# Patient Record
Sex: Female | Born: 1935 | Race: White | Hispanic: No | Marital: Married | Smoking: Never smoker
Health system: Southern US, Community
[De-identification: ages and names within clinical notes are randomized; demographics above are authoritative.]

## PROBLEM LIST (undated history)

## (undated) DIAGNOSIS — M549 Dorsalgia, unspecified: Secondary | ICD-10-CM

## (undated) DIAGNOSIS — I10 Essential (primary) hypertension: Secondary | ICD-10-CM

## (undated) DIAGNOSIS — H3530 Unspecified macular degeneration: Secondary | ICD-10-CM

## (undated) DIAGNOSIS — E785 Hyperlipidemia, unspecified: Secondary | ICD-10-CM

## (undated) DIAGNOSIS — N814 Uterovaginal prolapse, unspecified: Secondary | ICD-10-CM

## (undated) DIAGNOSIS — I4891 Unspecified atrial fibrillation: Secondary | ICD-10-CM

## (undated) DIAGNOSIS — B029 Zoster without complications: Secondary | ICD-10-CM

## (undated) DIAGNOSIS — K449 Diaphragmatic hernia without obstruction or gangrene: Secondary | ICD-10-CM

## (undated) DIAGNOSIS — J479 Bronchiectasis, uncomplicated: Secondary | ICD-10-CM

## (undated) DIAGNOSIS — R42 Dizziness and giddiness: Secondary | ICD-10-CM

## (undated) DIAGNOSIS — J984 Other disorders of lung: Secondary | ICD-10-CM

## (undated) HISTORY — DX: Uterovaginal prolapse, unspecified: N81.4

## (undated) HISTORY — PX: TONSILLECTOMY: SUR1361

## (undated) HISTORY — PX: ABDOMINAL HYSTERECTOMY: SHX81

## (undated) HISTORY — DX: Bronchiectasis, uncomplicated: J47.9

## (undated) HISTORY — DX: Diaphragmatic hernia without obstruction or gangrene: K44.9

## (undated) HISTORY — DX: Hyperlipidemia, unspecified: E78.5

## (undated) HISTORY — DX: Other disorders of lung: J98.4

## (undated) HISTORY — PX: JOINT REPLACEMENT: SHX530

## (undated) HISTORY — PX: COLON SURGERY: SHX602

## (undated) HISTORY — PX: CATARACT EXTRACTION: SUR2

## (undated) HISTORY — DX: Unspecified macular degeneration: H35.30

---

## 2006-06-06 ENCOUNTER — Ambulatory Visit: Payer: Self-pay | Admitting: Family Medicine

## 2007-08-14 ENCOUNTER — Ambulatory Visit: Payer: Self-pay | Admitting: Family Medicine

## 2008-08-26 ENCOUNTER — Ambulatory Visit: Payer: Self-pay | Admitting: Family Medicine

## 2009-08-29 ENCOUNTER — Ambulatory Visit: Payer: Self-pay | Admitting: Family Medicine

## 2010-09-04 ENCOUNTER — Ambulatory Visit: Payer: Self-pay | Admitting: Family Medicine

## 2010-12-07 ENCOUNTER — Ambulatory Visit: Payer: Self-pay | Admitting: Oncology

## 2010-12-22 ENCOUNTER — Ambulatory Visit: Payer: Self-pay | Admitting: Oncology

## 2011-01-07 ENCOUNTER — Ambulatory Visit: Payer: Self-pay | Admitting: Oncology

## 2011-02-06 ENCOUNTER — Ambulatory Visit: Payer: Self-pay | Admitting: Oncology

## 2011-08-18 ENCOUNTER — Emergency Department: Payer: Self-pay | Admitting: Emergency Medicine

## 2011-09-06 ENCOUNTER — Ambulatory Visit: Payer: Self-pay

## 2011-09-20 ENCOUNTER — Ambulatory Visit: Payer: Self-pay

## 2012-03-26 DIAGNOSIS — I10 Essential (primary) hypertension: Secondary | ICD-10-CM | POA: Insufficient documentation

## 2012-09-10 ENCOUNTER — Ambulatory Visit: Payer: Self-pay | Admitting: Pediatrics

## 2014-03-31 ENCOUNTER — Ambulatory Visit: Payer: Self-pay | Admitting: Pediatrics

## 2014-05-26 ENCOUNTER — Ambulatory Visit: Payer: Self-pay | Admitting: Ophthalmology

## 2014-06-23 ENCOUNTER — Ambulatory Visit: Payer: Self-pay | Admitting: Ophthalmology

## 2014-09-28 ENCOUNTER — Ambulatory Visit: Payer: Self-pay | Admitting: Family Medicine

## 2015-01-11 ENCOUNTER — Ambulatory Visit: Admit: 2015-01-11 | Disposition: A | Discharge status: Home / Self Care | Payer: Self-pay | Admitting: Specialist

## 2015-01-11 LAB — URINALYSIS, COMPLETE
BACTERIA: NONE SEEN
BLOOD: NEGATIVE
Bilirubin,UR: NEGATIVE
Glucose,UR: NEGATIVE mg/dL (ref 0–75)
Ketone: NEGATIVE
LEUKOCYTE ESTERASE: NEGATIVE
NITRITE: NEGATIVE
PH: 6 (ref 4.5–8.0)
Protein: NEGATIVE
RBC,UR: 1 /HPF (ref 0–5)
Specific Gravity: 1.009 (ref 1.003–1.030)
Squamous Epithelial: NONE SEEN

## 2015-01-11 LAB — BASIC METABOLIC PANEL
ANION GAP: 8 (ref 7–16)
BUN: 23 mg/dL — ABNORMAL HIGH
Calcium, Total: 9.1 mg/dL
Chloride: 104 mmol/L
Co2: 25 mmol/L
Creatinine: 0.77 mg/dL
EGFR (Non-African Amer.): >60
GLUCOSE: 86 mg/dL
POTASSIUM: 4.6 mmol/L
Sodium: 137 mmol/L

## 2015-01-11 LAB — APTT: ACTIVATED PTT: 29.9 s (ref 23.6–35.9)

## 2015-01-11 LAB — MRSA PCR SCREENING

## 2015-01-11 LAB — PROTIME-INR
INR: 1
PROTHROMBIN TIME: 13.1 s

## 2015-01-11 LAB — CBC
HCT: 38 % (ref 35.0–47.0)
HGB: 12.4 g/dL (ref 12.0–16.0)
MCH: 31.7 pg (ref 26.0–34.0)
MCHC: 32.6 g/dL (ref 32.0–36.0)
MCV: 97 fL (ref 80–100)
PLATELETS: 188 10*3/uL (ref 150–440)
RBC: 3.9 10*6/uL (ref 3.80–5.20)
RDW: 13.1 % (ref 11.5–14.5)
WBC: 5.4 10*3/uL (ref 3.6–11.0)

## 2015-01-18 ENCOUNTER — Inpatient Hospital Stay
Admit: 2015-01-18 | Disposition: A | Discharge status: Skilled Nursing Facility | Payer: Self-pay | Attending: Specialist | Admitting: Specialist

## 2015-01-18 LAB — CREATININE, SERUM
CREATININE: 0.68 mg/dL
EGFR (Non-African Amer.): >60

## 2015-01-19 LAB — HEMOGLOBIN: HGB: 8.9 g/dL — AB (ref 12.0–16.0)

## 2015-01-20 LAB — BASIC METABOLIC PANEL
ANION GAP: 8 (ref 7–16)
BUN: 20 mg/dL
CALCIUM: 8.4 mg/dL — AB
CO2: 22 mmol/L
Chloride: 101 mmol/L
Creatinine: 1.19 mg/dL — ABNORMAL HIGH
GFR CALC AF AMER: 51 — AB
GFR CALC NON AF AMER: 44 — AB
Glucose: 125 mg/dL — ABNORMAL HIGH
Potassium: 4.8 mmol/L
SODIUM: 131 mmol/L — AB

## 2015-01-20 LAB — CBC WITH DIFFERENTIAL/PLATELET
BASOS ABS: 0 10*3/uL (ref 0.0–0.1)
BASOS PCT: 0.4 %
EOS ABS: 0 10*3/uL (ref 0.0–0.7)
Eosinophil %: 0.1 %
HCT: 28.9 % — ABNORMAL LOW (ref 35.0–47.0)
HGB: 9.4 g/dL — ABNORMAL LOW (ref 12.0–16.0)
LYMPHS ABS: 1.1 10*3/uL (ref 1.0–3.6)
LYMPHS PCT: 14.2 %
MCH: 32.1 pg (ref 26.0–34.0)
MCHC: 32.5 g/dL (ref 32.0–36.0)
MCV: 99 fL (ref 80–100)
MONOS PCT: 12.6 %
Monocyte #: 1 x10 3/mm — ABNORMAL HIGH (ref 0.2–0.9)
NEUTROS ABS: 5.6 10*3/uL (ref 1.4–6.5)
Neutrophil %: 72.7 %
Platelet: 191 10*3/uL (ref 150–440)
RBC: 2.92 10*6/uL — AB (ref 3.80–5.20)
RDW: 13.7 % (ref 11.5–14.5)
WBC: 7.7 10*3/uL (ref 3.6–11.0)

## 2015-01-20 LAB — MAGNESIUM: MAGNESIUM: 2 mg/dL

## 2015-01-21 LAB — BASIC METABOLIC PANEL
ANION GAP: 3 — AB (ref 7–16)
BUN: 20 mg/dL
CREATININE: 0.88 mg/dL
Calcium, Total: 7.9 mg/dL — ABNORMAL LOW
Chloride: 106 mmol/L
Co2: 24 mmol/L
EGFR (African American): >60
EGFR (Non-African Amer.): >60
GLUCOSE: 100 mg/dL — AB
Potassium: 4 mmol/L
Sodium: 133 mmol/L — ABNORMAL LOW

## 2015-01-21 LAB — HEMOGLOBIN
HGB: 7.3 g/dL — AB (ref 12.0–16.0)
HGB: 9.4 g/dL — ABNORMAL LOW (ref 12.0–16.0)

## 2015-01-23 ENCOUNTER — Encounter
Admit: 2015-01-23 | Disposition: A | Discharge status: Home / Self Care | Payer: Self-pay | Attending: Internal Medicine | Admitting: Internal Medicine

## 2015-01-31 LAB — SURGICAL PATHOLOGY

## 2015-02-06 NOTE — Consult Note (Signed)
PATIENT NAME:  Amy Shah, Amy Shah MR#:  [id number] DATE OF BIRTH:  03-21-1935  DATE OF CONSULTATION:  01/20/2015  REFERRING PHYSICIAN:  Dr. Katrinka Blazing, orthopedic surgeon CONSULTING PHYSICIAN:  Shaune Pollack, MD  PRIMARY CARE PHYSICIAN: Vic Ripper. Mariana Kaufman, MD  REASON FOR CONSULTATION: Orthostatic hypotension.    REVIEW OF HISTORY: The patient is an 80 year old Caucasian female with severe degenerative arthritis and acid reflux, comes to the hospital for left hip surgery. The patient got a left total hip arthroplasty by Dr. Katrinka Blazing 2 days ago. The patient feels weak and dizzy while sitting or standing up. The patient describes she almost passed out while sitting. The patient's blood pressure while sitting was 93/59, but it decreased to 62/43 while standing. Heart rate increased from 98 to 23 while standing. At Dr. Michaelle Copas request, a medical consult for orthostatic hypotension. The patient denies any fever or chills. No chest pain, palpitations, shortness of breath. Denies any abdominal pain, melena or bloody stool. Denies any nausea, vomiting, or diarrhea, but has constipation for 4 days. She denies any active bleeding but she feels thirsty.   PAST MEDICAL HISTORY:  1.  Severe degenerative arthritis.  2.  GERD.   PAST SURGICAL HISTORY: Cataract surgery, tonsillectomy, partial hysterectomy, colon resection, appendectomy.   HOME MEDICATIONS: Lisinopril 10 mg p.o. daily.   ALLERGIES: CODEINE AND SULFA DRUGS.    SOCIAL HISTORY: No smoking or drinking or illicit drugs.   FAMILY HISTORY: Hypertension.   REVIEW OF SYSTEMS:  CONSTITUTIONAL: The patient denies any fever or chills. No headache but has dizziness and weakness.  EYES: No double vision or blurred vision.  EARS, NOSE, AND THROAT: No postnasal drip, slurred speech or dysphagia.  CARDIOVASCULAR: No chest pain, palpitation, orthopnea, or nocturnal dyspnea. No leg edema.  PULMONARY: No cough, sputum, shortness of breath, or hemoptysis.  GASTROINTESTINAL: No  abdominal pain, nausea, vomiting, or diarrhea. No melena or bloody stool, but has constipation for 4 days.  GENITOURINARY: No dysuria, hematuria or incontinence.  SKIN: No rash or jaundice.  NEUROLOGY: No syncope, loss of consciousness, or seizure, but the patient feels dizzy and almost passed out while sitting and standing.   HEMATOLOGY: No easy bruising or bleeding.  ENDOCRINE: No polyuria, polydipsia, heat or cold intolerance.  SKIN: No rash or jaundice.   PHYSICAL EXAMINATION: VITAL SIGNS: Temperature 97.8, blood pressure 93/59 in the sitting position and 62/43 in standing position. O2 saturation 100% on room air.  GENERAL: The patient is alert, awake, oriented, in no acute distress.  HEENT: Pupils round, equal and reactive to light and accommodation. Dry oral mucosa. Clear oropharynx.  NECK: Supple. No JVD or carotid bruit. No lymphadenopathy. No thyromegaly.  CARDIOVASCULAR: S1 and S2. Regular rate, rhythm. No murmurs or gallop.  PULMONARY: Bilateral air entry. No wheezing or rales. No use of accessory muscles to breathe.  ABDOMEN: Soft. No distention or tenderness. No organomegaly. Bowel sounds present.  EXTREMITIES: No edema, clubbing or cyanosis. No calf tenderness. Bilateral pedal pulses present.  SKIN: No rash or jaundice.  NEUROLOGY: A and O x 3. No focal deficit.   LABORATORY DATA: No lab today but according to previous lab, the patient's BUN was 23, creatinine was 0.77 on April 5. Electrolytes were normal. The patient's hemoglobin was 12.4 on April 5 and decreased to 8.9 on April 13. CBC, BMP and magnesium were ordered just now.   IMPRESSIONS: 1.  Orthostatic hypotension, possibly due to dehydration and anemia.  2.  Anemia.  3.  Dehydration.  4.  History of hypertension.   RECOMMENDATIONS:  1.  For anemia, I will repeat a hemoglobin which is due to acute blood loss secondary to surgery. I will repeat a hemoglobin and PRBC transfusion p.r.n.  2.  For dehydration, I will get  a BMP and start normal saline 100 mL per hour.  3.  For orthostatic hypotension due to dehydration, as mentioned above, I will start IV fluid support and monitor vital signs and hold lisinopril.  4.  For constipation, the patient is on milk of magnesia p.r.n. I will start a stool softener, Colace b.i.d.   I discussed the patient's condition and recommendations with the patient, the patient's husband, and the daughter and also discussed with RN.   TIME SPENT: About 60 minutes.    ____________________________ Shaune PollackQing Gaelle Adriance, MD qc:at D: 01/20/2015 16:08:23 ET T: 01/20/2015 16:25:58 ET JOB#: [id number]  cc: Shaune PollackQing Webb Weed, MD, <Dictator> Shaune PollackQING Chaslyn Eisen MD ELECTRONICALLY SIGNED 01/21/2015 15:05

## 2015-02-06 NOTE — Op Note (Signed)
PATIENT NAME:  Amy Shah, [name] MR#:  [id number] DATE OF BIRTH:  1935/08/28  DATE OF PROCEDURE:  01/18/2015  PREOPERATIVE DIAGNOSIS: Severe degenerative arthritis, left hip.   POSTOPERATIVE DIAGNOSIS: Severe degenerative arthritis, left hip.   PROCEDURE: Left total hip arthroplasty.   SURGEON: Myra Rudehristopher Carnel Stegman, M.D.   ASSISTANT: Deeann SaintHoward Miller, M.D.   ANESTHESIA: Spinal.   COMPLICATIONS: Small crack in the greater trochanteric area, which was treated with 2 Dall-Miles wires.   ESTIMATED BLOOD LOSS: 300 mL.   DRAINS: None.   DESCRIPTION OF PROCEDURE: Two grams of Ancef was given intravenously prior to the procedure. Spinal anesthesia is induced. The patient is placed in the right lateral decubitus position and secured with the hip grips in the usual manner for left hip surgery. The left hip is thoroughly prepped with alcohol and ChloraPrep and draped in standard sterile fashion. A standard posterolateral incision is made and the dissection carried down to the fascia lata which is incised in the line of the skin incision. Sciatic nerve is palpated deep within the wound and is protected throughout the case. Charnley retractor is placed. Trochanteric bursa is excised. The piriformis tendon is identified and tagged and cut off of the femur. The remainder of the external rotators are cut off of the femur. The periosteal elevator is used to dissect out the capsule. A T-incision is then made in the capsule and the ends are tagged with 0 Tycron. The hip is then easily dislocated and the femoral neck is cut off at the appropriate angle and length. Femoral head is measured at 46 mm in diameter. Hohmann retractors are placed to reveal the acetabulum and soft tissue debridement is performed. The acetabulum was serially reamed up to 47 mm. The trial 48 mm in diameter cup is impacted into place and is seen to be in good position. This is removed. The wound is thoroughly irrigated multiple times with the  pulsatile lavage. The 48 mm trispiked acetabulum is then impacted into place and was seen to be secure. Trial neutral liner is put into position. Proximal femur is then reamed in standard fashion up to a large 12 mm in diameter broach. A crack was produced in the greater trochanteric area extending approximately 2 inches and 2 Dall-Miles cables were placed around this to secure the crack. The wound is thoroughly irrigated multiple times. The porous-coated 12 mm small Press-Fit component is impacted into place and seen to be in good position. Trial reduction with a +1 ball demonstrates satisfactory alignment and length. The +1 permanent ball is then put into position. The trial acetabulum is removed and the hole eliminator and the neutral polyethylene liner are impacted into place. The hip is then reduced and is seen to be in satisfactory position with good stability. The wound is thoroughly irrigated multiple times. No drain was used. The fascia lata is closed with 0 Tycron. The subcutaneous tissue is closed with 2-0 Vicryl. The skin is closed with the skin stapler. A soft bulky dressing is applied. The patient is returned to the recovery room in satisfactory condition having tolerated the procedure quite well.  ____________________________ Clare Gandyhristopher E. Tynlee Bayle, MD ces:sb D: 01/18/2015 10:52:42 ET T: 01/18/2015 12:01:16 ET JOB#: [id number]  cc: Clare Gandyhristopher E. Reyan Helle, MD, <Dictator> Clare GandyHRISTOPHER E Rhena Glace MD ELECTRONICALLY SIGNED 01/20/2015 13:38

## 2015-02-06 NOTE — Discharge Summary (Signed)
PATIENT NAME:  Amy Shah, [name] MR#:  [id number] DATE OF BIRTH:  [birth year]  DATE OF ADMISSION:  01/18/2015 DATE OF DISCHARGE:  01/22/2015  She is to be transferred to skilled nursing today.   DISCHARGE DIAGNOSES: 1.  Severe degenerative osteoarthritis of the left hip.  2.  Postoperative anemia with orthostatic hypotension.  3.  Chronic hypertension.  4.  Gastroesophageal reflux disease.   OPERATIONS OR PROCEDURES PERFORMED: Left total hip replacement on 01/18/2015.   HISTORY AND PHYSICAL EXAMINATION: As written on admission.   LABORATORY DATA: As noted on the chart.   COURSE IN HOSPITAL: Following medical clearance and informed consent, the patient was taken to the Operating Room on 01/18/2015 where total hip replacement was performed without incident. She did have a small hairline fracture in the greater trochanteric area which necessitated 2 Dall-Miles cables. Postoperatively, the patient did well except for orthostatic hypotension when standing. A consultation was obtained with the hospitalist, and his note is as noted in the chart. Her hemoglobin was noted to be 7.3 and she was transfused 1 unit of packed cells. The hemoglobin later that day on 01/21/2015 was 9.4. By 01/22/2015, the patient was doing well and was up into the chair without any problem. She had no nausea. She is discharged to skilled nursing rehabilitation today via rescue.   DISCHARGE MEDICATIONS: Her medications are as dictated by the consulting hospitalist  and she is to get Lovenox 30 mg subcutaneous every 12 hours for 14 days. She is to return to the office to see Dr. Katrinka BlazingSmith in 10 to 12 days for staple removal. She is to have physical therapy, weight-bearing as tolerated using the walker while in rehabilitation. She is to be transferred today.     ____________________________ Clare Gandyhristopher E. Yaritsa Savarino, MD ces:at D: 01/22/2015 11:05:36 ET T: 01/22/2015 11:17:57 ET JOB#: [id number]  cc: Clare Gandyhristopher E. Ambria Mayfield, MD,  <Dictator> Clare GandyHRISTOPHER E Shela Esses MD ELECTRONICALLY SIGNED 02/04/2015 13:04

## 2015-03-26 ENCOUNTER — Emergency Department
Admission: EM | Admit: 2015-03-26 | Discharge: 2015-03-26 | Disposition: A | Discharge status: Home / Self Care | Payer: Medicare Other | Attending: Emergency Medicine | Admitting: Emergency Medicine

## 2015-03-26 ENCOUNTER — Other Ambulatory Visit: Payer: Self-pay

## 2015-03-26 DIAGNOSIS — R42 Dizziness and giddiness: Secondary | ICD-10-CM | POA: Insufficient documentation

## 2015-03-26 DIAGNOSIS — I10 Essential (primary) hypertension: Secondary | ICD-10-CM | POA: Insufficient documentation

## 2015-03-26 DIAGNOSIS — Z79899 Other long term (current) drug therapy: Secondary | ICD-10-CM | POA: Diagnosis not present

## 2015-03-26 DIAGNOSIS — E860 Dehydration: Secondary | ICD-10-CM | POA: Diagnosis not present

## 2015-03-26 HISTORY — DX: Dorsalgia, unspecified: M54.9

## 2015-03-26 HISTORY — DX: Zoster without complications: B02.9

## 2015-03-26 HISTORY — DX: Dizziness and giddiness: R42

## 2015-03-26 HISTORY — DX: Essential (primary) hypertension: I10

## 2015-03-26 LAB — BASIC METABOLIC PANEL
Anion gap: 5 (ref 5–15)
BUN: 21 mg/dL — ABNORMAL HIGH (ref 6–20)
CALCIUM: 9.1 mg/dL (ref 8.9–10.3)
CHLORIDE: 109 mmol/L (ref 101–111)
CO2: 26 mmol/L (ref 22–32)
Creatinine, Ser: 0.73 mg/dL (ref 0.44–1.00)
GFR calc Af Amer: >60 mL/min (ref >60)
GFR calc non Af Amer: >60 mL/min (ref >60)
GLUCOSE: 97 mg/dL (ref 65–99)
Potassium: 3.9 mmol/L (ref 3.5–5.1)
SODIUM: 140 mmol/L (ref 135–145)

## 2015-03-26 LAB — URINALYSIS COMPLETE WITH MICROSCOPIC (ARMC ONLY)
BACTERIA UA: NONE SEEN
Bilirubin Urine: NEGATIVE
GLUCOSE, UA: NEGATIVE mg/dL
HGB URINE DIPSTICK: NEGATIVE
Ketones, ur: NEGATIVE mg/dL
Leukocytes, UA: NEGATIVE
Nitrite: NEGATIVE
PROTEIN: NEGATIVE mg/dL
RBC / HPF: NONE SEEN RBC/hpf (ref 0–5)
Specific Gravity, Urine: 1.004 — ABNORMAL LOW (ref 1.005–1.030)
Squamous Epithelial / LPF: NONE SEEN
pH: 7 (ref 5.0–8.0)

## 2015-03-26 LAB — CBC
HEMATOCRIT: 35.2 % (ref 35.0–47.0)
HEMOGLOBIN: 11.5 g/dL — AB (ref 12.0–16.0)
MCH: 31.8 pg (ref 26.0–34.0)
MCHC: 32.6 g/dL (ref 32.0–36.0)
MCV: 97.7 fL (ref 80.0–100.0)
Platelets: 213 10*3/uL (ref 150–440)
RBC: 3.61 MIL/uL — AB (ref 3.80–5.20)
RDW: 14.3 % (ref 11.5–14.5)
WBC: 3.9 10*3/uL (ref 3.6–11.0)

## 2015-03-26 LAB — TROPONIN I: Troponin I: <0.03 ng/mL (ref <0.031)

## 2015-03-26 MED ORDER — SODIUM CHLORIDE 0.9 % IV BOLUS (SEPSIS)
1000.0000 mL | Freq: Once | INTRAVENOUS | Status: AC
  Administered 2015-03-26: 1000 mL via INTRAVENOUS

## 2015-03-26 NOTE — Discharge Instructions (Signed)
Dehydration Dehydration is when you lose more fluids from the body than you take in. Vital organs such as the kidneys, brain, and heart cannot function without a proper amount of fluids and salt. Any loss of fluids from the body can cause dehydration.  Older adults are at a higher risk of dehydration than younger adults. As we age, our bodies are less able to conserve water and do not respond to temperature changes as well. Also, older adults do not become thirsty as easily or quickly. Because of this, older adults often do not realize they need to increase fluids to avoid dehydration.  CAUSES   Vomiting.  Diarrhea.  Excessive sweating.  Excessive urination.  Fever.  Certain medicines, such as blood pressure medicines called diuretics.  Poorly controlled blood sugars. SIGNS AND SYMPTOMS  Mild dehydration:  Thirst.  Dry lips.  Slightly dry mouth. Moderate dehydration:  Very dry mouth.  Sunken eyes.  Skin does not bounce back quickly when lightly pinched and released.  Dark urine and decreased urine production.  Decreased tear production.  Headache. Severe dehydration:  Very dry mouth.  Extreme thirst.  Rapid, weak pulse (more than 100 beats per minute at rest).  Cold hands and feet.  Not able to sweat in spite of heat.  Rapid breathing.  Blue lips.  Confusion and lethargy.  Difficulty being awakened.  Minimal urine production.  No tears. DIAGNOSIS  Your health care provider will diagnose dehydration based on your symptoms and your exam. Blood and urine tests will help confirm the diagnosis. The diagnostic evaluation should also identify the cause of dehydration. TREATMENT  Treatment of mild or moderate dehydration can often be done at home by increasing the amount of fluids that you drink. It is best to drink small amounts of fluid more often. Drinking too much at one time can make vomiting worse. Severe dehydration needs to be treated at the hospital.  You may be given IV fluids that contain water and electrolytes. HOME CARE INSTRUCTIONS   Ask your health care provider about specific rehydration instructions.  Drink enough fluids to keep your urine clear or pale yellow.  Drink small amounts frequently if you have nausea and vomiting.  Eat as you normally do.  Avoid:  Foods or drinks high in sugar.  Carbonated drinks.  Juice.  Extremely hot or cold fluids.  Drinks with caffeine.  Fatty, greasy foods.  Alcohol.  Tobacco.  Overeating.  Gelatin desserts.  Wash your hands well to avoid spreading bacteria and viruses.  Only take over-the-counter or prescription medicines for pain, discomfort, or fever as directed by your health care provider.  Ask your health care provider if you should continue all prescribed and over-the-counter medicines.  Keep all follow-up appointments with your health care provider. SEEK MEDICAL CARE IF:  You have abdominal pain, and it increases or stays in one area (localizes).  You have a rash, stiff neck, or severe headache.  You are irritable, sleepy, or difficult to awaken.  You are weak, dizzy, or extremely thirsty.  You have a fever. SEEK IMMEDIATE MEDICAL CARE IF:   You are unable to keep fluids down, or you get worse despite treatment.  You have frequent episodes of vomiting or diarrhea.  You have blood or green matter (bile) in your vomit.  You have blood in your stool, or your stool looks black and tarry.  You have not urinated in 6-8 hours, or you have only urinated a small amount of very dark urine.    You faint. MAKE SURE YOU:   Understand these instructions.  Will watch your condition.  Will get help right away if you are not doing well or get worse. Document Released: 12/15/2003 Document Revised: 09/29/2013 Document Reviewed: 06/01/2013 [employer] Patient Information 2015 [employer]. This information is not intended to replace advice given to you by your  health care provider. Make sure you discuss any questions you have with your health care provider.  

## 2015-03-26 NOTE — ED Notes (Signed)
Woke this morning feeling a little light headed.  Worse when standing.  Has hx of vertigo, but this feels different.  Hands and feet were very sweaty

## 2015-03-26 NOTE — ED Provider Notes (Signed)
[facility] Emergency Department Provider Note  ____________________________________________  Time seen: 3:45 PM   I have reviewed the triage vital signs and the nursing notes.   HISTORY  Chief Complaint Dizziness    HPI Amy Shah is a 80 y.o. female who was in her usual state of health when she went to bed last night. When she woke up this morning, she felt lightheaded after standing up from bed. She sat back down and felt better again. She has not been eating well today, and although she's been trying to eat and drink fluids recently she feels like she's not been keeping up with the unusually hot weather with temperatures of 90 each day. No chest pain abdominal pain back pain shortness of breath headache vision change. No falls or injuries. No dysuria frequency urgency cough fever or chills. No vomiting or diarrhea.  The lightheadedness is associated with mild nausea and darkening of the vision and feeling like she is going to pass out, and improves when she sits back down.     Past Medical History  Diagnosis Date  . Hypertension   . Back pain   . Vertigo   . Shingles     There are no active problems to display for this patient.   Past Surgical History  Procedure Laterality Date  . Joint replacement    . Cataract extraction      Current Outpatient Rx  Name  Route  Sig  Dispense  Refill  . lisinopril (PRINIVIL,ZESTRIL) 10 MG tablet   Oral   Take 10 mg by mouth daily.         Marland Kitchen LORazepam (ATIVAN) 0.5 MG tablet   Oral   Take 0.5 mg by mouth at bedtime.           Allergies Codeine; Tetanus toxoids; and Sulfa antibiotics  History reviewed. No pertinent family history.  Social History History  Substance Use Topics  . Smoking status: Never Smoker   . Smokeless tobacco: Not on file  . Alcohol Use: 0.6 oz/week    1 Glasses of wine per week    Review of Systems  Constitutional: No fever or chills. No weight changes Eyes:No  blurry vision or double vision.  ENT: No sore throat. Cardiovascular: No chest pain. Respiratory: No dyspnea or cough. Gastrointestinal: Negative for abdominal pain, vomiting and diarrhea.  No BRBPR or melena. Genitourinary: Negative for dysuria, urinary retention, bloody urine, or difficulty urinating. Musculoskeletal: Negative for back pain. No joint swelling or pain. Skin: Negative for rash. Neurological: Negative for headaches, focal weakness or numbness. Positive lightheadedness on standing Psychiatric:No anxiety or depression.   Endocrine:No hot/cold intolerance, changes in energy, or sleep difficulty.  10-point ROS otherwise negative.  ____________________________________________   PHYSICAL EXAM:  VITAL SIGNS: ED Triage Vitals  Enc Vitals Group     BP 03/26/15 1439 153/90 mmHg     Pulse Rate 03/26/15 1439 78     Resp --      Temp 03/26/15 1439 98.2 F (36.8 C)     Temp Source 03/26/15 1439 Oral     SpO2 03/26/15 1439 100 %     Weight 03/26/15 1439 146 lb (66.225 kg)     Height 03/26/15 1439 5\' 4"  (1.626 m)     Head Cir --      Peak Flow --      Pain Score --      Pain Loc --      Pain Edu? --  Excl. in GC? --      Constitutional: Alert and oriented. Well appearing and in no distress. Eyes: No scleral icterus. No conjunctival pallor. PERRL. EOMI ENT   Head: Normocephalic and atraumatic.   Nose: No congestion/rhinnorhea. No septal hematoma   Mouth/Throat: Dry mucous membranes, no pharyngeal erythema. No peritonsillar mass. No uvula shift.   Neck: No stridor. No SubQ emphysema. No meningismus. Hematological/Lymphatic/Immunilogical: No cervical lymphadenopathy. Cardiovascular: RRR. Normal and symmetric distal pulses are present in all extremities. No murmurs, rubs, or gallops. Respiratory: Normal respiratory effort without tachypnea nor retractions. Breath sounds are clear and equal bilaterally. No wheezes/rales/rhonchi. Gastrointestinal: Soft  and nontender. No distention. There is no CVA tenderness.  No rebound, rigidity, or guarding. Genitourinary: deferred Musculoskeletal: Nontender with normal range of motion in all extremities. No joint effusions.  No lower extremity tenderness.  No edema. Neurologic:   Normal speech and language.  CN 2-10 normal. Motor grossly intact. No pronator drift.  Normal gait. No gross focal neurologic deficits are appreciated.  Skin:  Skin is warm, dry and intact. No rash noted.  No petechiae, purpura, or bullae. Poor skin turgor Psychiatric: Mood and affect are normal. Speech and behavior are normal. Patient exhibits appropriate insight and judgment.  ____________________________________________    LABS (pertinent positives/negatives) (all labs ordered are listed, but only abnormal results are displayed) Labs Reviewed  CBC - Abnormal; Notable for the following:    RBC 3.61 (*)    Hemoglobin 11.5 (*)    All other components within normal limits  BASIC METABOLIC PANEL - Abnormal; Notable for the following:    BUN 21 (*)    All other components within normal limits  TROPONIN I   ____________________________________________   EKG  Interpreted by me  Date: 03/26/2015  Rate: 76  Rhythm: normal sinus rhythm  QRS Axis: normal  Intervals: normal  ST/T Wave abnormalities: normal  Conduction Disutrbances: none  Narrative Interpretation: unremarkable      ____________________________________________    RADIOLOGY    ____________________________________________   PROCEDURES  ____________________________________________   INITIAL IMPRESSION / ASSESSMENT AND PLAN / ED COURSE  Pertinent labs & imaging results that were available during my care of the patient were reviewed by me and considered in my medical decision making (see chart for details).  Patient presents with symptoms consistent with dehydration causing some orthostatic symptoms with the lightheadedness. No evidence  of trauma. No evidence of ACS PE TAD pneumothorax carditis AAA sepsis abdominal pathology intracranial hemorrhage or stroke. Nothing to suggest that this is cardiovascular in nature. We'll give her IV fluids and check labs as a screening measure, and plan to discharge her home to follow up with primary care.  ____________________________________________   FINAL CLINICAL IMPRESSION(S) / ED DIAGNOSES  Final diagnoses:  Orthostatic dizziness  Mild dehydration      Sharman Cheek, MD 03/26/15 1655

## 2016-11-30 DIAGNOSIS — H353132 Nonexudative age-related macular degeneration, bilateral, intermediate dry stage: Secondary | ICD-10-CM | POA: Diagnosis not present

## 2017-03-11 DIAGNOSIS — H353132 Nonexudative age-related macular degeneration, bilateral, intermediate dry stage: Secondary | ICD-10-CM | POA: Diagnosis not present

## 2017-03-22 DIAGNOSIS — Z9181 History of falling: Secondary | ICD-10-CM | POA: Diagnosis not present

## 2017-03-22 DIAGNOSIS — I10 Essential (primary) hypertension: Secondary | ICD-10-CM | POA: Diagnosis not present

## 2017-03-22 DIAGNOSIS — Z6825 Body mass index (BMI) 25.0-25.9, adult: Secondary | ICD-10-CM | POA: Diagnosis not present

## 2017-03-22 DIAGNOSIS — Z0000 Encounter for general adult medical examination without abnormal findings: Secondary | ICD-10-CM | POA: Diagnosis not present

## 2017-03-22 DIAGNOSIS — R5383 Other fatigue: Secondary | ICD-10-CM | POA: Diagnosis not present

## 2017-04-05 DIAGNOSIS — H353132 Nonexudative age-related macular degeneration, bilateral, intermediate dry stage: Secondary | ICD-10-CM | POA: Diagnosis not present

## 2017-04-09 DIAGNOSIS — H353132 Nonexudative age-related macular degeneration, bilateral, intermediate dry stage: Secondary | ICD-10-CM | POA: Diagnosis not present

## 2017-07-01 DIAGNOSIS — Z23 Encounter for immunization: Secondary | ICD-10-CM | POA: Diagnosis not present

## 2017-10-11 DIAGNOSIS — H26499 Other secondary cataract, unspecified eye: Secondary | ICD-10-CM | POA: Diagnosis not present

## 2017-10-11 DIAGNOSIS — H353132 Nonexudative age-related macular degeneration, bilateral, intermediate dry stage: Secondary | ICD-10-CM | POA: Diagnosis not present

## 2017-10-31 DIAGNOSIS — M5136 Other intervertebral disc degeneration, lumbar region: Secondary | ICD-10-CM | POA: Diagnosis not present

## 2017-10-31 DIAGNOSIS — Z96649 Presence of unspecified artificial hip joint: Secondary | ICD-10-CM | POA: Diagnosis not present

## 2017-10-31 DIAGNOSIS — M419 Scoliosis, unspecified: Secondary | ICD-10-CM | POA: Insufficient documentation

## 2017-10-31 DIAGNOSIS — M533 Sacrococcygeal disorders, not elsewhere classified: Secondary | ICD-10-CM | POA: Diagnosis not present

## 2017-10-31 DIAGNOSIS — M4186 Other forms of scoliosis, lumbar region: Secondary | ICD-10-CM | POA: Diagnosis not present

## 2018-01-03 DIAGNOSIS — Z6826 Body mass index (BMI) 26.0-26.9, adult: Secondary | ICD-10-CM | POA: Diagnosis not present

## 2018-01-03 DIAGNOSIS — J479 Bronchiectasis, uncomplicated: Secondary | ICD-10-CM | POA: Diagnosis not present

## 2018-01-03 DIAGNOSIS — Z0000 Encounter for general adult medical examination without abnormal findings: Secondary | ICD-10-CM | POA: Diagnosis not present

## 2018-01-14 DIAGNOSIS — B353 Tinea pedis: Secondary | ICD-10-CM | POA: Diagnosis not present

## 2018-01-14 DIAGNOSIS — Z6826 Body mass index (BMI) 26.0-26.9, adult: Secondary | ICD-10-CM | POA: Diagnosis not present

## 2018-01-14 DIAGNOSIS — B351 Tinea unguium: Secondary | ICD-10-CM | POA: Diagnosis not present

## 2018-04-25 DIAGNOSIS — H353132 Nonexudative age-related macular degeneration, bilateral, intermediate dry stage: Secondary | ICD-10-CM | POA: Diagnosis not present

## 2018-07-04 DIAGNOSIS — Z23 Encounter for immunization: Secondary | ICD-10-CM | POA: Diagnosis not present

## 2018-07-04 DIAGNOSIS — R5383 Other fatigue: Secondary | ICD-10-CM | POA: Diagnosis not present

## 2018-07-04 DIAGNOSIS — G5601 Carpal tunnel syndrome, right upper limb: Secondary | ICD-10-CM | POA: Diagnosis not present

## 2018-07-04 DIAGNOSIS — Z6825 Body mass index (BMI) 25.0-25.9, adult: Secondary | ICD-10-CM | POA: Diagnosis not present

## 2018-07-24 DIAGNOSIS — D7589 Other specified diseases of blood and blood-forming organs: Secondary | ICD-10-CM | POA: Diagnosis not present

## 2018-08-28 ENCOUNTER — Encounter: Payer: Self-pay | Admitting: Family Medicine

## 2018-08-28 ENCOUNTER — Ambulatory Visit (INDEPENDENT_AMBULATORY_CARE_PROVIDER_SITE_OTHER): Payer: Medicare Other | Admitting: Family Medicine

## 2018-08-28 VITALS — BP 120/80 | HR 68 | Ht 63.5 in | Wt 150.0 lb

## 2018-08-28 DIAGNOSIS — Z7689 Persons encountering health services in other specified circumstances: Secondary | ICD-10-CM

## 2018-08-28 DIAGNOSIS — I10 Essential (primary) hypertension: Secondary | ICD-10-CM

## 2018-08-28 DIAGNOSIS — M169 Osteoarthritis of hip, unspecified: Secondary | ICD-10-CM | POA: Insufficient documentation

## 2018-08-28 DIAGNOSIS — M4126 Other idiopathic scoliosis, lumbar region: Secondary | ICD-10-CM

## 2018-08-28 NOTE — Progress Notes (Signed)
Date:  08/28/2018   Name:  Amy Shah   DOB:  03/17/1935   MRN:  [id number]   Chief Complaint: Establish Care (elevation in MCV) Patient is a 83 year old female who presents for a establishment of care  exam. The patient reports the following problems: review lab, scoliosis, and fatigue. Health maintenance has been reviewed none.    Review of Systems  Constitutional: Negative.  Negative for chills, fatigue, fever and unexpected weight change.  HENT: Negative for congestion, ear discharge, ear pain, rhinorrhea, sinus pressure, sneezing and sore throat.   Eyes: Negative for photophobia, pain, discharge, redness and itching.  Respiratory: Negative for cough, shortness of breath, wheezing and stridor.   Gastrointestinal: Negative for abdominal pain, blood in stool, constipation, diarrhea, nausea and vomiting.  Endocrine: Negative for cold intolerance, heat intolerance, polydipsia, polyphagia and polyuria.  Genitourinary: Negative for dysuria, flank pain, frequency, hematuria, menstrual problem, pelvic pain, urgency, vaginal bleeding and vaginal discharge.  Musculoskeletal: Negative for arthralgias, back pain and myalgias.  Skin: Negative for rash.  Allergic/Immunologic: Negative for environmental allergies and food allergies.  Neurological: Negative for dizziness, weakness, light-headedness, numbness and headaches.  Hematological: Negative for adenopathy. Does not bruise/bleed easily.  Psychiatric/Behavioral: Negative for dysphoric mood. The patient is not nervous/anxious.     Patient Active Problem List   Diagnosis Date Noted  . Osteoarthritis of hip 08/28/2018  . Degeneration of lumbar intervertebral disc 10/31/2017  . History of total hip arthroplasty 10/31/2017  . Sacroiliac joint pain 10/31/2017  . Scoliosis of lumbar spine 10/31/2017  . Risk for falls 03/22/2017  . Hypertension, benign 03/26/2012    Allergies  Allergen Reactions  . Codeine Nausea Only and Other (See  Comments)    Dizzy and nausea  . Tetanus Toxoids Swelling and Other (See Comments)    Swelling and fever  . Sulfa Antibiotics Rash    Past Surgical History:  Procedure Laterality Date  . ABDOMINAL HYSTERECTOMY     partial/ left cervix  . CATARACT EXTRACTION    . COLON SURGERY     colectomy- removed polyp in cecrum- benign  . JOINT REPLACEMENT Left    hip  . TONSILLECTOMY      Social History   Tobacco Use  . Smoking status: Never Smoker  . Smokeless tobacco: Never Used  Substance Use Topics  . Alcohol use: Yes    Alcohol/week: 1.0 standard drinks    Types: 1 Glasses of wine per week    Comment: once a year  . Drug use: No     Medication list has been reviewed and updated.  Current Meds  Medication Sig  . acetaminophen (TYLENOL) 500 MG tablet Take 1,000 mg by mouth 2 (two) times daily.  Marland Kitchen. albuterol (PROAIR HFA) 108 (90 Base) MCG/ACT inhaler Inhale 1 puff into the lungs 2 (two) times a week.  . Multiple Vitamins-Minerals (PRESERVISION AREDS PO) Take 2 tablets by mouth daily.    PHQ 2/9 Scores 08/28/2018  PHQ - 2 Score 0  PHQ- 9 Score 0    Physical Exam  Constitutional: She is oriented to person, place, and time. She appears well-developed and well-nourished.  HENT:  Head: Normocephalic.  Right Ear: External ear normal.  Left Ear: External ear normal.  Mouth/Throat: Oropharynx is clear and moist.  Eyes: Pupils are equal, round, and reactive to light. Conjunctivae and EOM are normal. Lids are everted and swept, no foreign bodies found. Left eye exhibits no hordeolum. No foreign body present in the  left eye. Right conjunctiva is not injected. Left conjunctiva is not injected. No scleral icterus.  Neck: Normal range of motion. Neck supple. No JVD present. No tracheal deviation present. No thyromegaly present.  Cardiovascular: Normal rate, regular rhythm, normal heart sounds and intact distal pulses. Exam reveals no gallop and no friction rub.  No murmur  heard. Pulmonary/Chest: Effort normal and breath sounds normal. No respiratory distress. She has no wheezes. She has no rales.  Abdominal: Soft. Bowel sounds are normal. She exhibits no mass. There is no hepatosplenomegaly. There is no tenderness. There is no rebound and no guarding.  Musculoskeletal: Normal range of motion. She exhibits no edema or tenderness.  Lymphadenopathy:    She has no cervical adenopathy.  Neurological: She is alert and oriented to person, place, and time. She has normal strength. She displays normal reflexes. No cranial nerve deficit.  Skin: Skin is warm. No rash noted.  Psychiatric: She has a normal mood and affect. Her mood appears not anxious. She does not exhibit a depressed mood.  Nursing note and vitals reviewed.   BP 120/80   Pulse 68   Ht 5' 3.5" (1.613 m)   Wt 150 lb (68 kg)   BMI 26.15 kg/m   Assessment and Plan:  1. Establishing care with new doctor, encounter for Patient establish care with new physician.  2. Other idiopathic scoliosis, lumbar region Patient has chronic lower back pain secondary to scoliosis.  Followed by Dr. Hyacinth Meeker at [facility].  Will defer care to orthopedics as needed.  May take Tylenol as needed pain  3. Hypertension, benign Blood pressure elevated in the past.  We will continue with controlling with low-sodium and weight control.   Dr. [facility]  08/28/2018

## 2018-09-18 DIAGNOSIS — M533 Sacrococcygeal disorders, not elsewhere classified: Secondary | ICD-10-CM | POA: Diagnosis not present

## 2018-09-18 DIAGNOSIS — Z96649 Presence of unspecified artificial hip joint: Secondary | ICD-10-CM | POA: Diagnosis not present

## 2018-09-18 DIAGNOSIS — M5136 Other intervertebral disc degeneration, lumbar region: Secondary | ICD-10-CM | POA: Diagnosis not present

## 2018-10-31 DIAGNOSIS — H353132 Nonexudative age-related macular degeneration, bilateral, intermediate dry stage: Secondary | ICD-10-CM | POA: Diagnosis not present

## 2018-12-24 DIAGNOSIS — H26492 Other secondary cataract, left eye: Secondary | ICD-10-CM | POA: Diagnosis not present

## 2019-03-04 ENCOUNTER — Other Ambulatory Visit: Payer: Self-pay

## 2019-03-04 ENCOUNTER — Encounter: Payer: Self-pay | Admitting: Family Medicine

## 2019-03-04 ENCOUNTER — Ambulatory Visit (INDEPENDENT_AMBULATORY_CARE_PROVIDER_SITE_OTHER): Payer: Medicare Other | Admitting: Family Medicine

## 2019-03-04 VITALS — BP 121/68 | HR 61 | Resp 16 | Ht 63.5 in | Wt 144.0 lb

## 2019-03-04 DIAGNOSIS — M5136 Other intervertebral disc degeneration, lumbar region: Secondary | ICD-10-CM

## 2019-03-04 DIAGNOSIS — I10 Essential (primary) hypertension: Secondary | ICD-10-CM

## 2019-03-04 NOTE — Patient Instructions (Signed)
DASH Eating Plan  DASH stands for "Dietary Approaches to Stop Hypertension." The DASH eating plan is a healthy eating plan that has been shown to reduce high blood pressure (hypertension). It may also reduce your risk for type 2 diabetes, heart disease, and stroke. The DASH eating plan may also help with weight loss.  What are tips for following this plan?    General guidelines   Avoid eating more than 2,300 mg (milligrams) of salt (sodium) a day. If you have hypertension, you may need to reduce your sodium intake to 1,500 mg a day.   Limit alcohol intake to no more than 1 drink a day for nonpregnant women and 2 drinks a day for men. One drink equals 12 oz of beer, 5 oz of wine, or 1 oz of hard liquor.   Work with your health care provider to maintain a healthy body weight or to lose weight. Ask what an ideal weight is for you.   Get at least 30 minutes of exercise that causes your heart to beat faster (aerobic exercise) most days of the week. Activities may include walking, swimming, or biking.   Work with your health care provider or diet and nutrition specialist (dietitian) to adjust your eating plan to your individual calorie needs.  Reading food labels     Check food labels for the amount of sodium per serving. Choose foods with less than 5 percent of the Daily Value of sodium. Generally, foods with less than 300 mg of sodium per serving fit into this eating plan.   To find whole grains, look for the word "whole" as the first word in the ingredient list.  Shopping   Buy products labeled as "low-sodium" or "no salt added."   Buy fresh foods. Avoid canned foods and premade or frozen meals.  Cooking   Avoid adding salt when cooking. Use salt-free seasonings or herbs instead of table salt or sea salt. Check with your health care provider or pharmacist before using salt substitutes.   Do not fry foods. Cook foods using healthy methods such as baking, boiling, grilling, and broiling instead.   Cook with  heart-healthy oils, such as olive, canola, soybean, or sunflower oil.  Meal planning   Eat a balanced diet that includes:  ? 5 or more servings of fruits and vegetables each day. At each meal, try to fill half of your plate with fruits and vegetables.  ? Up to 6-8 servings of whole grains each day.  ? Less than 6 oz of lean meat, poultry, or fish each day. A 3-oz serving of meat is about the same size as a deck of cards. One egg equals 1 oz.  ? 2 servings of low-fat dairy each day.  ? A serving of nuts, seeds, or beans 5 times each week.  ? Heart-healthy fats. Healthy fats called Omega-3 fatty acids are found in foods such as flaxseeds and coldwater fish, like sardines, salmon, and mackerel.   Limit how much you eat of the following:  ? Canned or prepackaged foods.  ? Food that is high in trans fat, such as fried foods.  ? Food that is high in saturated fat, such as fatty meat.  ? Sweets, desserts, sugary drinks, and other foods with added sugar.  ? Full-fat dairy products.   Do not salt foods before eating.   Try to eat at least 2 vegetarian meals each week.   Eat more home-cooked food and less restaurant, buffet, and fast food.     When eating at a restaurant, ask that your food be prepared with less salt or no salt, if possible.  What foods are recommended?  The items listed may not be a complete list. Talk with your dietitian about what dietary choices are best for you.  Grains  Whole-grain or whole-wheat bread. Whole-grain or whole-wheat pasta. Brown rice. Oatmeal. Quinoa. Bulgur. Whole-grain and low-sodium cereals. Pita bread. Low-fat, low-sodium crackers. Whole-wheat flour tortillas.  Vegetables  Fresh or frozen vegetables (raw, steamed, roasted, or grilled). Low-sodium or reduced-sodium tomato and vegetable juice. Low-sodium or reduced-sodium tomato sauce and tomato paste. Low-sodium or reduced-sodium canned vegetables.  Fruits  All fresh, dried, or frozen fruit. Canned fruit in natural juice (without  added sugar).  Meat and other protein foods  Skinless chicken or turkey. Ground chicken or turkey. Pork with fat trimmed off. Fish and seafood. Egg whites. Dried beans, peas, or lentils. Unsalted nuts, nut butters, and seeds. Unsalted canned beans. Lean cuts of beef with fat trimmed off. Low-sodium, lean deli meat.  Dairy  Low-fat (1%) or fat-free (skim) milk. Fat-free, low-fat, or reduced-fat cheeses. Nonfat, low-sodium ricotta or cottage cheese. Low-fat or nonfat yogurt. Low-fat, low-sodium cheese.  Fats and oils  Soft margarine without trans fats. Vegetable oil. Low-fat, reduced-fat, or light mayonnaise and salad dressings (reduced-sodium). Canola, safflower, olive, soybean, and sunflower oils. Avocado.  Seasoning and other foods  Herbs. Spices. Seasoning mixes without salt. Unsalted popcorn and pretzels. Fat-free sweets.  What foods are not recommended?  The items listed may not be a complete list. Talk with your dietitian about what dietary choices are best for you.  Grains  Baked goods made with fat, such as croissants, muffins, or some breads. Dry pasta or rice meal packs.  Vegetables  Creamed or fried vegetables. Vegetables in a cheese sauce. Regular canned vegetables (not low-sodium or reduced-sodium). Regular canned tomato sauce and paste (not low-sodium or reduced-sodium). Regular tomato and vegetable juice (not low-sodium or reduced-sodium). Pickles. Olives.  Fruits  Canned fruit in a light or heavy syrup. Fried fruit. Fruit in cream or butter sauce.  Meat and other protein foods  Fatty cuts of meat. Ribs. Fried meat. Bacon. Sausage. Bologna and other processed lunch meats. Salami. Fatback. Hotdogs. Bratwurst. Salted nuts and seeds. Canned beans with added salt. Canned or smoked fish. Whole eggs or egg yolks. Chicken or turkey with skin.  Dairy  Whole or 2% milk, cream, and half-and-half. Whole or full-fat cream cheese. Whole-fat or sweetened yogurt. Full-fat cheese. Nondairy creamers. Whipped toppings.  Processed cheese and cheese spreads.  Fats and oils  Butter. Stick margarine. Lard. Shortening. Ghee. Bacon fat. Tropical oils, such as coconut, palm kernel, or palm oil.  Seasoning and other foods  Salted popcorn and pretzels. Onion salt, garlic salt, seasoned salt, table salt, and sea salt. Worcestershire sauce. Tartar sauce. Barbecue sauce. Teriyaki sauce. Soy sauce, including reduced-sodium. Steak sauce. Canned and packaged gravies. Fish sauce. Oyster sauce. Cocktail sauce. Horseradish that you find on the shelf. Ketchup. Mustard. Meat flavorings and tenderizers. Bouillon cubes. Hot sauce and Tabasco sauce. Premade or packaged marinades. Premade or packaged taco seasonings. Relishes. Regular salad dressings.  Where to find more information:   National Heart, Lung, and Blood Institute: www.nhlbi.nih.gov   American Heart Association: www.heart.org  Summary   The DASH eating plan is a healthy eating plan that has been shown to reduce high blood pressure (hypertension). It may also reduce your risk for type 2 diabetes, heart disease, and stroke.   With the   DASH eating plan, you should limit salt (sodium) intake to 2,300 mg a day. If you have hypertension, you may need to reduce your sodium intake to 1,500 mg a day.   When on the DASH eating plan, aim to eat more fresh fruits and vegetables, whole grains, lean proteins, low-fat dairy, and heart-healthy fats.   Work with your health care provider or diet and nutrition specialist (dietitian) to adjust your eating plan to your individual calorie needs.  This information is not intended to replace advice given to you by your health care provider. Make sure you discuss any questions you have with your health care provider.  Document Released: 09/13/2011 Document Revised: 09/17/2016 Document Reviewed: 09/17/2016  Elsevier Interactive Patient Education  2019 [employer]

## 2019-03-04 NOTE — Progress Notes (Signed)
Date:  03/04/2019   Name:  Amy Shah   DOB:  06/02/1935   MRN:  [id number]   Chief Complaint: Back Pain  Back Pain  This is a chronic problem. The current episode started more than 1 year ago. The problem occurs daily. The problem has been waxing and waning since onset. The pain is present in the lumbar spine. The quality of the pain is described as aching. The pain does not radiate. The pain is at a severity of 4/10. The pain is moderate. The pain is worse during the day. The symptoms are aggravated by bending ("moving around"). Associated symptoms include bladder incontinence and pelvic pain. Pertinent negatives include no abdominal pain, bowel incontinence, dysuria, fever, headaches, numbness, paresis, paresthesias, tingling, weakness or weight loss. She has tried NSAIDs for the symptoms. The treatment provided mild relief.    Review of Systems  Constitutional: Negative.  Negative for chills, fatigue, fever, unexpected weight change and weight loss.  HENT: Negative for congestion, ear discharge, ear pain, rhinorrhea, sinus pressure, sneezing and sore throat.   Eyes: Negative for photophobia, pain, discharge, redness and itching.  Respiratory: Negative for cough, shortness of breath, wheezing and stridor.   Gastrointestinal: Negative for abdominal pain, blood in stool, bowel incontinence, constipation, diarrhea, nausea and vomiting.  Endocrine: Negative for cold intolerance, heat intolerance, polydipsia, polyphagia and polyuria.  Genitourinary: Positive for bladder incontinence and pelvic pain. Negative for dysuria, flank pain, frequency, hematuria, menstrual problem, urgency, vaginal bleeding and vaginal discharge.  Musculoskeletal: Positive for back pain. Negative for arthralgias and myalgias.  Skin: Negative for rash.  Allergic/Immunologic: Negative for environmental allergies and food allergies.  Neurological: Negative for dizziness, tingling, weakness, light-headedness, numbness,  headaches and paresthesias.  Hematological: Negative for adenopathy. Does not bruise/bleed easily.  Psychiatric/Behavioral: Negative for dysphoric mood. The patient is not nervous/anxious.     Patient Active Problem List   Diagnosis Date Noted  . Osteoarthritis of hip 08/28/2018  . Degeneration of lumbar intervertebral disc 10/31/2017  . History of total hip arthroplasty 10/31/2017  . Sacroiliac joint pain 10/31/2017  . Scoliosis of lumbar spine 10/31/2017  . Risk for falls 03/22/2017  . Hypertension, benign 03/26/2012    Allergies  Allergen Reactions  . Codeine Nausea Only and Other (See Comments)    Dizzy and nausea  . Tetanus Toxoids Swelling and Other (See Comments)    Swelling and fever  . Sulfa Antibiotics Rash    Past Surgical History:  Procedure Laterality Date  . ABDOMINAL HYSTERECTOMY     partial/ left cervix  . CATARACT EXTRACTION    . COLON SURGERY     colectomy- removed polyp in cecrum- benign  . JOINT REPLACEMENT Left    hip  . TONSILLECTOMY      Social History   Tobacco Use  . Smoking status: Never Smoker  . Smokeless tobacco: Never Used  Substance Use Topics  . Alcohol use: Yes    Alcohol/week: 1.0 standard drinks    Types: 1 Glasses of wine per week    Comment: once a year  . Drug use: No     Medication list has been reviewed and updated.  Current Meds  Medication Sig  . acetaminophen (TYLENOL) 500 MG tablet Take 1,000 mg by mouth 2 (two) times daily.  Marland Kitchen. albuterol (PROAIR HFA) 108 (90 Base) MCG/ACT inhaler Inhale 1 puff into the lungs 2 (two) times a week.  . Cholecalciferol (VITAMIN D-3) 25 MCG (1000 UT) CAPS Take 1 capsule by  mouth daily.  . Ibuprofen (ADVIL) 200 MG CAPS Take by mouth.  . Multiple Vitamins-Minerals (PRESERVISION AREDS PO) Take 2 tablets by mouth daily.    PHQ 2/9 Scores 03/04/2019 08/28/2018  PHQ - 2 Score 0 0  PHQ- 9 Score 0 0    BP Readings from Last 3 Encounters:  03/04/19 121/68  08/28/18 120/80  03/26/15 (!)  124/58    Physical Exam Vitals signs and nursing note reviewed.  Constitutional:      General: She is not in acute distress.    Appearance: She is not diaphoretic.  HENT:     Head: Normocephalic and atraumatic.     Right Ear: Tympanic membrane, ear canal and external ear normal.     Left Ear: Tympanic membrane, ear canal and external ear normal.     Nose: Nose normal.  Eyes:     General:        Right eye: No discharge.        Left eye: No discharge.     Conjunctiva/sclera: Conjunctivae normal.     Pupils: Pupils are equal, round, and reactive to light.  Neck:     Musculoskeletal: Normal range of motion and neck supple.     Thyroid: No thyromegaly.     Vascular: No JVD.  Cardiovascular:     Rate and Rhythm: Normal rate and regular rhythm.     Chest Wall: PMI is not displaced.     Pulses: Normal pulses.     Heart sounds: Normal heart sounds, S1 normal and S2 normal. No murmur. No systolic murmur. No diastolic murmur. No friction rub. No gallop. No S3 or S4 sounds.   Pulmonary:     Effort: Pulmonary effort is normal.     Breath sounds: Normal breath sounds. No wheezing, rhonchi or rales.  Abdominal:     General: Bowel sounds are normal.     Palpations: Abdomen is soft. There is no mass.     Tenderness: There is no abdominal tenderness. There is no guarding.  Musculoskeletal: Normal range of motion.     Right shoulder: She exhibits deformity.     Right lower leg: No edema.     Left lower leg: No edema.     Comments: Mild scoliosis  Lymphadenopathy:     Cervical: No cervical adenopathy.  Skin:    General: Skin is warm and dry.  Neurological:     Mental Status: She is alert.     Deep Tendon Reflexes: Reflexes are normal and symmetric.     Wt Readings from Last 3 Encounters:  03/04/19 144 lb (65.3 kg)  08/28/18 150 lb (68 kg)  03/26/15 146 lb (66.2 kg)    BP 121/68   Pulse 61   Resp 16   Ht 5' 3.5" (1.613 m)   Wt 144 lb (65.3 kg)   SpO2 97%   BMI 25.11 kg/m    Assessment and Plan:  1. Hypertension, benign Controlled we will continue to dietarily approach this issue.  DASH diet given and sodium restriction discussed.  2. Degeneration of lumbar intervertebral disc Lumbar sacral degenerative disease followed  by Dr. Hyacinth Meeker.  Encouraged patient to continue follow-up with Dr. Hyacinth Meeker as well as with her hip arthritis and sacroiliitis.

## 2019-04-02 DIAGNOSIS — Z96642 Presence of left artificial hip joint: Secondary | ICD-10-CM | POA: Diagnosis not present

## 2019-04-02 DIAGNOSIS — R0781 Pleurodynia: Secondary | ICD-10-CM | POA: Diagnosis not present

## 2019-04-02 DIAGNOSIS — M5136 Other intervertebral disc degeneration, lumbar region: Secondary | ICD-10-CM | POA: Diagnosis not present

## 2019-04-21 DIAGNOSIS — M25552 Pain in left hip: Secondary | ICD-10-CM | POA: Diagnosis not present

## 2019-04-21 DIAGNOSIS — M545 Low back pain: Secondary | ICD-10-CM | POA: Diagnosis not present

## 2019-04-29 DIAGNOSIS — M545 Low back pain: Secondary | ICD-10-CM | POA: Diagnosis not present

## 2019-04-29 DIAGNOSIS — M25552 Pain in left hip: Secondary | ICD-10-CM | POA: Diagnosis not present

## 2019-05-01 DIAGNOSIS — H353132 Nonexudative age-related macular degeneration, bilateral, intermediate dry stage: Secondary | ICD-10-CM | POA: Diagnosis not present

## 2019-05-04 DIAGNOSIS — M25552 Pain in left hip: Secondary | ICD-10-CM | POA: Diagnosis not present

## 2019-05-04 DIAGNOSIS — M545 Low back pain: Secondary | ICD-10-CM | POA: Diagnosis not present

## 2019-05-06 DIAGNOSIS — M545 Low back pain: Secondary | ICD-10-CM | POA: Diagnosis not present

## 2019-05-06 DIAGNOSIS — M25552 Pain in left hip: Secondary | ICD-10-CM | POA: Diagnosis not present

## 2019-05-13 DIAGNOSIS — M545 Low back pain: Secondary | ICD-10-CM | POA: Diagnosis not present

## 2019-05-13 DIAGNOSIS — M25552 Pain in left hip: Secondary | ICD-10-CM | POA: Diagnosis not present

## 2019-05-20 DIAGNOSIS — M545 Low back pain: Secondary | ICD-10-CM | POA: Diagnosis not present

## 2019-05-20 DIAGNOSIS — M25552 Pain in left hip: Secondary | ICD-10-CM | POA: Diagnosis not present

## 2019-05-21 DIAGNOSIS — M5136 Other intervertebral disc degeneration, lumbar region: Secondary | ICD-10-CM | POA: Diagnosis not present

## 2019-06-17 DIAGNOSIS — Z23 Encounter for immunization: Secondary | ICD-10-CM | POA: Diagnosis not present

## 2019-09-14 ENCOUNTER — Other Ambulatory Visit: Payer: Self-pay

## 2019-09-14 ENCOUNTER — Telehealth: Payer: Self-pay

## 2019-09-14 MED ORDER — ALBUTEROL SULFATE HFA 108 (90 BASE) MCG/ACT IN AERS: 1.0000 | INHALATION_SPRAY | RESPIRATORY_TRACT | 0 refills | Status: DC

## 2019-09-14 NOTE — Telephone Encounter (Signed)
Pt called wanting refill on inhaler- sent in [place] and sched appt for this week

## 2019-09-17 ENCOUNTER — Ambulatory Visit
Admission: RE | Admit: 2019-09-17 | Discharge: 2019-09-17 | Disposition: A | Discharge status: Home / Self Care | Payer: Medicare Other | Source: Ambulatory Visit | Attending: Family Medicine | Admitting: Family Medicine

## 2019-09-17 ENCOUNTER — Ambulatory Visit (INDEPENDENT_AMBULATORY_CARE_PROVIDER_SITE_OTHER): Payer: Medicare Other | Admitting: Family Medicine

## 2019-09-17 ENCOUNTER — Encounter: Payer: Self-pay | Admitting: Family Medicine

## 2019-09-17 ENCOUNTER — Ambulatory Visit
Admission: RE | Admit: 2019-09-17 | Discharge: 2019-09-17 | Disposition: A | Discharge status: Home / Self Care | Payer: Medicare Other | Attending: Family Medicine | Admitting: Family Medicine

## 2019-09-17 ENCOUNTER — Other Ambulatory Visit: Payer: Self-pay

## 2019-09-17 VITALS — BP 138/76 | HR 80 | Ht 63.5 in | Wt 142.0 lb

## 2019-09-17 DIAGNOSIS — J9819 Other pulmonary collapse: Secondary | ICD-10-CM

## 2019-09-17 DIAGNOSIS — J984 Other disorders of lung: Secondary | ICD-10-CM

## 2019-09-17 DIAGNOSIS — Z8709 Personal history of other diseases of the respiratory system: Secondary | ICD-10-CM | POA: Diagnosis not present

## 2019-09-17 MED ORDER — ALBUTEROL SULFATE HFA 108 (90 BASE) MCG/ACT IN AERS: 1.0000 | INHALATION_SPRAY | RESPIRATORY_TRACT | 5 refills | Status: DC

## 2019-09-17 NOTE — Progress Notes (Signed)
Date:  09/17/2019   Name:  Amy Shah   DOB:  02-11-1935   MRN:  [id number]   Chief Complaint: Asthma  Asthma She complains of cough, shortness of breath and wheezing. There is no chest tightness, difficulty breathing, frequent throat clearing, hemoptysis, hoarse voice or sputum production. 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 The current episode started more than 1 year ago. The problem has been gradually improving. The cough is non-productive. Pertinent negatives include no appetite change, chest pain, dyspnea on exertion, ear congestion, ear pain, fever, headaches, heartburn, malaise/fatigue, myalgias, nasal congestion, orthopnea, PND, postnasal drip, rhinorrhea, sneezing, sore throat, sweats, trouble swallowing or weight loss. Her symptoms are alleviated by beta-agonist. She reports minimal improvement on treatment. Her past medical history is significant for asthma and bronchiectasis. There is no history of bronchitis, COPD, emphysema or pneumonia.    Lab Results  Component Value Date   CREATININE 0.73 03/26/2015   BUN 21 (H) 03/26/2015   NA 140 03/26/2015   K 3.9 03/26/2015   CL 109 03/26/2015   CO2 26 03/26/2015   No results found for: CHOL, HDL, LDLCALC, LDLDIRECT, TRIG, CHOLHDL No results found for: TSH No results found for: HGBA1C   Review of Systems  Constitutional: Negative.  Negative for appetite change, chills, fatigue, fever, malaise/fatigue, unexpected weight change and weight loss.  HENT: Negative for congestion, ear discharge, ear pain, hoarse voice, postnasal drip, rhinorrhea, sinus pressure, sneezing, sore throat and trouble swallowing.   Eyes: Positive for visual disturbance. Negative for photophobia, pain, discharge, redness and itching.       Macular degeneration  Respiratory: Positive for cough, shortness of breath and wheezing. Negative for hemoptysis, sputum production and stridor.    Cardiovascular: Negative for chest pain, dyspnea on exertion and PND.  Gastrointestinal: Negative for abdominal pain, blood in stool, constipation, diarrhea, heartburn, nausea and vomiting.  Endocrine: Negative for cold intolerance, heat intolerance, polydipsia, polyphagia and polyuria.  Genitourinary: Negative for dysuria, flank pain, frequency, hematuria, menstrual problem, pelvic pain, urgency, vaginal bleeding and vaginal discharge.  Musculoskeletal: Positive for back pain. Negative for arthralgias and myalgias.  Skin: Negative for rash.  Allergic/Immunologic: Negative for environmental allergies and food allergies.  Neurological: Negative for dizziness, weakness, light-headedness, numbness and headaches.  Hematological: Negative for adenopathy. Does not bruise/bleed easily.  Psychiatric/Behavioral: Negative for dysphoric mood. The patient is not nervous/anxious.     Patient Active Problem List   Diagnosis Date Noted  . Osteoarthritis of hip 08/28/2018  . Degeneration of lumbar intervertebral disc 10/31/2017  . History of total hip arthroplasty 10/31/2017  . Sacroiliac joint pain 10/31/2017  . Scoliosis of lumbar spine 10/31/2017  . Risk for falls 03/22/2017  . Hypertension, benign 03/26/2012    Allergies  Allergen Reactions  . Codeine Nausea Only and Other (See Comments)    Dizzy and nausea  . Tetanus Toxoids Swelling and Other (See Comments)    Swelling and fever  . Sulfa Antibiotics Rash    Past Surgical History:  Procedure Laterality Date  . ABDOMINAL HYSTERECTOMY     partial/ left cervix  . CATARACT EXTRACTION    . COLON SURGERY     colectomy- removed polyp in cecrum- benign  . JOINT REPLACEMENT Left    hip  . TONSILLECTOMY      Social History   Tobacco Use  . Smoking status: Never Smoker  . Smokeless tobacco: Never Used  Substance Use Topics  . Alcohol  use: Yes    Alcohol/week: 1.0 standard drinks    Types: 1 Glasses of wine per week    Comment: once a  year  . Drug use: No     Medication list has been reviewed and updated.  Current Meds  Medication Sig  . acetaminophen (TYLENOL) 500 MG tablet Take 1,000 mg by mouth 2 (two) times daily.  Marland Kitchen. albuterol (PROAIR HFA) 108 (90 Base) MCG/ACT inhaler Inhale 1 puff into the lungs 2 (two) times a week.  . Cholecalciferol (VITAMIN D-3) 25 MCG (1000 UT) CAPS Take 1 capsule by mouth daily.  . diclofenac Sodium (VOLTAREN) 1 % GEL ortho  . Ibuprofen (ADVIL) 200 MG CAPS Take by mouth.  . Multiple Vitamins-Minerals (PRESERVISION AREDS PO) Take 2 tablets by mouth daily.    PHQ 2/9 Scores 09/17/2019 03/04/2019 08/28/2018  PHQ - 2 Score 0 0 0  PHQ- 9 Score 0 0 0    BP Readings from Last 3 Encounters:  09/17/19 138/76  03/04/19 121/68  08/28/18 120/80    Physical Exam Vitals and nursing note reviewed.  Constitutional:      General: She is not in acute distress.    Appearance: She is not diaphoretic.  HENT:     Head: Normocephalic and atraumatic.     Right Ear: Tympanic membrane and external ear normal.     Left Ear: Tympanic membrane and external ear normal.     Nose: Nose normal.  Eyes:     General:        Right eye: No discharge.        Left eye: No discharge.     Conjunctiva/sclera: Conjunctivae normal.     Pupils: Pupils are equal, round, and reactive to light.  Neck:     Thyroid: No thyromegaly.     Vascular: No JVD.  Cardiovascular:     Rate and Rhythm: Normal rate and regular rhythm.     Pulses: Normal pulses.     Heart sounds: Normal heart sounds, S1 normal and S2 normal. No murmur. No systolic murmur. No diastolic murmur. No friction rub. No gallop. No S3 or S4 sounds.   Pulmonary:     Effort: Pulmonary effort is normal. No respiratory distress.     Breath sounds: No stridor. Examination of the right-middle field reveals decreased breath sounds. Examination of the right-lower field reveals decreased breath sounds. Decreased breath sounds present. No wheezing, rhonchi or rales.   Abdominal:     General: Bowel sounds are normal.     Palpations: Abdomen is soft. There is no mass.     Tenderness: There is no abdominal tenderness. There is no guarding.  Musculoskeletal:        General: Normal range of motion.     Right hand: Normal strength. Normal capillary refill. Normal pulse.     Cervical back: Normal range of motion and neck supple.     Thoracic back: Deformity present.     Lumbar back: Deformity present.     Comments: Consistent with scoliosis  Lymphadenopathy:     Cervical: No cervical adenopathy.  Skin:    General: Skin is warm and dry.  Neurological:     Mental Status: She is alert.     Sensory: Sensory deficit present.     Deep Tendon Reflexes: Reflexes are normal and symmetric.     Comments: Tinel/phelen right     Wt Readings from Last 3 Encounters:  09/17/19 142 lb (64.4 kg)  03/04/19 144 lb (65.3 kg)  08/28/18 150 lb (68 kg)    BP 138/76   Pulse 80   Ht 5' 3.5" (1.613 m)   Wt 142 lb (64.4 kg)   SpO2 97%   BMI 24.76 kg/m    Assessment and Plan: 1. Small airways disease On review of the patient's previous pulmonary it was noted on a pulmonary function test that patient had studies that are consistent with small airway disease presumably intermittent mild asthma.  Will refill albuterol inhaler 1 to 2 puffs as needed.  And because of exam noting decreased breath sounds in the right middle and lower lobes we will obtain a chest x-ray. - DG Chest 2 View; Future - albuterol (PROAIR HFA) 108 (90 Base) MCG/ACT inhaler; Inhale 1 puff into the lungs 2 (two) times a week.  Dispense: 6.7 g; Refill: 5  2. History of bronchiectasis By history patient says that she has bronchiectasis I do not see exactly where this is but we will obtain a chest x-ray given the decreased breath sounds in the noted areas above - DG Chest 2 View; Future  3. Right middle lobe syndrome Patient was also told that she had right middle lobe syndrome by her pulmonologist at  [facility] several years ago.  Patient has not been symptomatic from this nor has she had to see a pulmonologist in the past 10 years.  As noted above there is some decreased breath sounds in the right middle lobe lower lobe and we will check a chest x-ray. - DG Chest 2 View; Future

## 2019-10-30 DIAGNOSIS — H353132 Nonexudative age-related macular degeneration, bilateral, intermediate dry stage: Secondary | ICD-10-CM | POA: Diagnosis not present

## 2019-12-02 ENCOUNTER — Ambulatory Visit (INDEPENDENT_AMBULATORY_CARE_PROVIDER_SITE_OTHER): Payer: Medicare Other | Admitting: Family Medicine

## 2019-12-02 ENCOUNTER — Encounter: Payer: Self-pay | Admitting: Family Medicine

## 2019-12-02 ENCOUNTER — Other Ambulatory Visit: Payer: Self-pay

## 2019-12-02 VITALS — BP 126/70 | HR 64 | Ht 63.5 in | Wt 141.0 lb

## 2019-12-02 DIAGNOSIS — R030 Elevated blood-pressure reading, without diagnosis of hypertension: Secondary | ICD-10-CM

## 2019-12-02 NOTE — Patient Instructions (Signed)

## 2019-12-02 NOTE — Progress Notes (Signed)
Date:  12/02/2019   Name:  Amy Shah   DOB:  23-Jun-1935   MRN:  [id number]   Chief Complaint: Hypertension (use to be on lisinopril 2.5mg - stopped by [facility] doctor)  Hypertension This is a chronic problem. The current episode started more than 1 year ago. The problem has been rapidly improving since onset. The problem is controlled. Pertinent negatives include no anxiety, blurred vision, chest pain, headaches, malaise/fatigue, neck pain, orthopnea, palpitations, peripheral edema, PND, shortness of breath or sweats. There are no associated agents to hypertension. There are no known risk factors for coronary artery disease. Past treatments include lifestyle changes (sodium restriction/weight loss). The current treatment provides moderate improvement. There are no compliance problems.  There is no history of angina, kidney disease, CAD/MI, CVA, heart failure, PVD or retinopathy. There is no history of chronic renal disease, a hypertension causing med or renovascular disease.    Lab Results  Component Value Date   CREATININE 0.73 03/26/2015   BUN 21 (H) 03/26/2015   NA 140 03/26/2015   K 3.9 03/26/2015   CL 109 03/26/2015   CO2 26 03/26/2015   No results found for: CHOL, HDL, LDLCALC, LDLDIRECT, TRIG, CHOLHDL No results found for: TSH No results found for: HGBA1C   Review of Systems  Constitutional: Negative for chills, fever and malaise/fatigue.  HENT: Negative for drooling, ear discharge, ear pain and sore throat.   Eyes: Negative for blurred vision.  Respiratory: Negative for cough, shortness of breath and wheezing.   Cardiovascular: Negative for chest pain, palpitations, orthopnea, leg swelling and PND.  Gastrointestinal: Negative for abdominal pain, blood in stool, constipation, diarrhea and nausea.  Endocrine: Negative for polydipsia.  Genitourinary: Negative for dysuria, frequency, hematuria and urgency.  Musculoskeletal: Negative for back pain, myalgias and neck pain.    Skin: Negative for rash.  Allergic/Immunologic: Negative for environmental allergies.  Neurological: Negative for dizziness and headaches.  Hematological: Does not bruise/bleed easily.  Psychiatric/Behavioral: Negative for suicidal ideas. The patient is not nervous/anxious.     Patient Active Problem List   Diagnosis Date Noted  . Osteoarthritis of hip 08/28/2018  . Degeneration of lumbar intervertebral disc 10/31/2017  . History of total hip arthroplasty 10/31/2017  . Sacroiliac joint pain 10/31/2017  . Scoliosis of lumbar spine 10/31/2017  . Risk for falls 03/22/2017  . Hypertension, benign 03/26/2012    Allergies  Allergen Reactions  . Codeine Nausea Only and Other (See Comments)    Dizzy and nausea  . Tetanus Toxoids Swelling and Other (See Comments)    Swelling and fever  . Sulfa Antibiotics Rash    Past Surgical History:  Procedure Laterality Date  . ABDOMINAL HYSTERECTOMY     partial/ left cervix  . CATARACT EXTRACTION    . COLON SURGERY     colectomy- removed polyp in cecrum- benign  . JOINT REPLACEMENT Left    hip  . TONSILLECTOMY      Social History   Tobacco Use  . Smoking status: Never Smoker  . Smokeless tobacco: Never Used  Substance Use Topics  . Alcohol use: Yes    Alcohol/week: 1.0 standard drinks    Types: 1 Glasses of wine per week    Comment: once a year  . Drug use: No     Medication list has been reviewed and updated.  Current Meds  Medication Sig  . acetaminophen (TYLENOL) 500 MG tablet Take 1,000 mg by mouth 2 (two) times daily.  Marland Kitchen albuterol (PROAIR HFA) 108 (  90 Base) MCG/ACT inhaler Inhale 1 puff into the lungs 2 (two) times a week.  . Cholecalciferol (VITAMIN D-3) 25 MCG (1000 UT) CAPS Take 1 capsule by mouth daily.  . Ibuprofen (ADVIL) 200 MG CAPS Take by mouth.  . Multiple Vitamins-Minerals (PRESERVISION AREDS PO) Take 2 tablets by mouth daily.    PHQ 2/9 Scores 12/02/2019 09/17/2019 03/04/2019 08/28/2018  PHQ - 2 Score 0 0  0 0  PHQ- 9 Score 0 0 0 0    BP Readings from Last 3 Encounters:  12/02/19 126/70  09/17/19 138/76  03/04/19 121/68    Physical Exam Vitals and nursing note reviewed.  Constitutional:      General: She is not in acute distress.    Appearance: She is well-developed.  HENT:     Head: Normocephalic.     Right Ear: Tympanic membrane and external ear normal.     Left Ear: Tympanic membrane and external ear normal.  Eyes:     General: Lids are everted, no foreign bodies appreciated. No scleral icterus.       Left eye: No foreign body or hordeolum.     Conjunctiva/sclera: Conjunctivae normal.     Right eye: Right conjunctiva is not injected.     Left eye: Left conjunctiva is not injected.     Pupils: Pupils are equal, round, and reactive to light.  Neck:     Thyroid: No thyromegaly.     Vascular: No JVD.     Trachea: No tracheal deviation.  Cardiovascular:     Rate and Rhythm: Normal rate and regular rhythm.     Heart sounds: Normal heart sounds. No murmur. No friction rub. No gallop.   Pulmonary:     Effort: Pulmonary effort is normal. No respiratory distress.     Breath sounds: Normal breath sounds. No wheezing or rales.  Abdominal:     General: Bowel sounds are normal.     Palpations: Abdomen is soft. There is no mass.     Tenderness: There is no abdominal tenderness. There is no guarding or rebound.  Musculoskeletal:        General: No tenderness. Normal range of motion.     Cervical back: Normal range of motion and neck supple.  Lymphadenopathy:     Cervical: No cervical adenopathy.  Skin:    General: Skin is warm.     Findings: No rash.  Neurological:     Mental Status: She is alert and oriented to person, place, and time.     Cranial Nerves: No cranial nerve deficit.     Deep Tendon Reflexes: Reflexes normal.  Psychiatric:        Mood and Affect: Mood is not anxious or depressed.     Wt Readings from Last 3 Encounters:  12/02/19 141 lb (64 kg)  09/17/19 142  lb (64.4 kg)  03/04/19 144 lb (65.3 kg)    BP 126/70   Pulse 64   Ht 5' 3.5" (1.613 m)   Wt 141 lb (64 kg)   BMI 24.59 kg/m   Assessment and Plan:  1. Elevated blood pressure, situational Chronic.  Controlled.  Stable.  Patient has been treated for essential hypertension with lisinopril in the past.  This is gradually been weaned away and patient has noted some elevated blood pressures in the past but not to a remarkable amount.  Today's blood pressure is unremarkable in terms of elevation and we will probably not reissue her medication but encourage low sodium intake.

## 2020-03-08 ENCOUNTER — Ambulatory Visit: Payer: Medicare Other | Admitting: Family Medicine

## 2020-03-15 ENCOUNTER — Ambulatory Visit (INDEPENDENT_AMBULATORY_CARE_PROVIDER_SITE_OTHER): Payer: Medicare Other | Admitting: Family Medicine

## 2020-03-15 ENCOUNTER — Encounter: Payer: Self-pay | Admitting: Family Medicine

## 2020-03-15 ENCOUNTER — Other Ambulatory Visit: Payer: Self-pay

## 2020-03-15 VITALS — BP 138/80 | HR 60 | Ht 63.5 in | Wt 143.0 lb

## 2020-03-15 DIAGNOSIS — Z862 Personal history of diseases of the blood and blood-forming organs and certain disorders involving the immune mechanism: Secondary | ICD-10-CM

## 2020-03-15 DIAGNOSIS — M4126 Other idiopathic scoliosis, lumbar region: Secondary | ICD-10-CM

## 2020-03-15 DIAGNOSIS — E785 Hyperlipidemia, unspecified: Secondary | ICD-10-CM | POA: Diagnosis not present

## 2020-03-15 DIAGNOSIS — R269 Unspecified abnormalities of gait and mobility: Secondary | ICD-10-CM

## 2020-03-15 DIAGNOSIS — R030 Elevated blood-pressure reading, without diagnosis of hypertension: Secondary | ICD-10-CM | POA: Diagnosis not present

## 2020-03-15 DIAGNOSIS — Z8639 Personal history of other endocrine, nutritional and metabolic disease: Secondary | ICD-10-CM

## 2020-03-15 NOTE — Progress Notes (Signed)
Date:  03/15/2020   Name:  Amy Shah   DOB:  [birth year]   MRN:  [id number]   Chief Complaint: follow up b/p (b/p recheck- higher than last time)  Hypertension This is a chronic problem. The current episode started more than 1 year ago. The problem is controlled. Pertinent negatives include no anxiety, blurred vision, chest pain, headaches, malaise/fatigue, neck pain, orthopnea, palpitations, peripheral edema, PND, shortness of breath or sweats. There are no associated agents to hypertension.    Lab Results  Component Value Date   CREATININE 0.73 03/26/2015   BUN 21 (H) 03/26/2015   NA 140 03/26/2015   K 3.9 03/26/2015   CL 109 03/26/2015   CO2 26 03/26/2015   No results found for: CHOL, HDL, LDLCALC, LDLDIRECT, TRIG, CHOLHDL No results found for: TSH No results found for: HGBA1C Lab Results  Component Value Date   WBC 3.9 03/26/2015   HGB 11.5 (L) 03/26/2015   HCT 35.2 03/26/2015   MCV 97.7 03/26/2015   PLT 213 03/26/2015   No results found for: ALT, AST, GGT, ALKPHOS, BILITOT   Review of Systems  Constitutional: Negative.  Negative for chills, fatigue, fever, malaise/fatigue and unexpected weight change.  HENT: Negative for congestion, ear discharge, ear pain, rhinorrhea, sinus pressure, sneezing and sore throat.   Eyes: Negative for blurred vision, photophobia, pain, discharge, redness and itching.  Respiratory: Negative for cough, shortness of breath, wheezing and stridor.   Cardiovascular: Negative for chest pain, palpitations, orthopnea and PND.  Gastrointestinal: Negative for abdominal pain, blood in stool, constipation, diarrhea, nausea and vomiting.  Endocrine: Negative for cold intolerance, heat intolerance, polydipsia, polyphagia and polyuria.  Genitourinary: Negative for dysuria, flank pain, frequency, hematuria, menstrual problem, pelvic pain, urgency, vaginal bleeding and vaginal discharge.  Musculoskeletal: Negative for arthralgias, back pain, myalgias  and neck pain.  Skin: Negative for rash.  Allergic/Immunologic: Negative for environmental allergies and food allergies.  Neurological: Negative for dizziness, weakness, light-headedness, numbness and headaches.  Hematological: Negative for adenopathy. Does not bruise/bleed easily.  Psychiatric/Behavioral: Negative for dysphoric mood. The patient is not nervous/anxious.     Patient Active Problem List   Diagnosis Date Noted  . Osteoarthritis of hip 08/28/2018  . Degeneration of lumbar intervertebral disc 10/31/2017  . History of total hip arthroplasty 10/31/2017  . Sacroiliac joint pain 10/31/2017  . Scoliosis of lumbar spine 10/31/2017  . Risk for falls 03/22/2017    Allergies  Allergen Reactions  . Codeine Nausea Only and Other (See Comments)    Dizzy and nausea  . Tetanus Toxoids Swelling and Other (See Comments)    Swelling and fever  . Sulfa Antibiotics Rash    Past Surgical History:  Procedure Laterality Date  . ABDOMINAL HYSTERECTOMY     partial/ left cervix  . CATARACT EXTRACTION    . COLON SURGERY     colectomy- removed polyp in cecrum- benign  . JOINT REPLACEMENT Left    hip  . TONSILLECTOMY      Social History   Tobacco Use  . Smoking status: Never Smoker  . Smokeless tobacco: Never Used  Substance Use Topics  . Alcohol use: Yes    Alcohol/week: 1.0 standard drinks    Types: 1 Glasses of wine per week    Comment: once a year  . Drug use: No     Medication list has been reviewed and updated.  Current Meds  Medication Sig  . acetaminophen (TYLENOL) 500 MG tablet Take 1,000 mg by mouth  2 (two) times daily.  Marland Kitchen albuterol (PROAIR HFA) 108 (90 Base) MCG/ACT inhaler Inhale 1 puff into the lungs 2 (two) times a week.  . Cholecalciferol (VITAMIN D-3) 25 MCG (1000 UT) CAPS Take 1 capsule by mouth daily.  . Ibuprofen (ADVIL) 200 MG CAPS Take by mouth.  . Multiple Vitamins-Minerals (PRESERVISION AREDS PO) Take 2 tablets by mouth daily.  . [DISCONTINUED]  diclofenac Sodium (VOLTAREN) 1 % GEL ortho    PHQ 2/9 Scores 03/15/2020 12/02/2019 09/17/2019 03/04/2019  PHQ - 2 Score 0 0 0 0  PHQ- 9 Score 0 0 0 0    BP Readings from Last 3 Encounters:  03/15/20 140/80  12/02/19 126/70  09/17/19 138/76    Physical Exam Vitals and nursing note reviewed.  Constitutional:      Appearance: She is well-developed.  HENT:     Head: Normocephalic.     Right Ear: Tympanic membrane, ear canal and external ear normal.     Left Ear: Tympanic membrane, ear canal and external ear normal.     Nose: Nose normal. No congestion or rhinorrhea.  Eyes:     General: Lids are everted, no foreign bodies appreciated. No scleral icterus.       Left eye: No foreign body or hordeolum.     Conjunctiva/sclera: Conjunctivae normal.     Right eye: Right conjunctiva is not injected.     Left eye: Left conjunctiva is not injected.     Pupils: Pupils are equal, round, and reactive to light.  Neck:     Thyroid: No thyromegaly.     Vascular: No JVD.     Trachea: No tracheal deviation.  Cardiovascular:     Rate and Rhythm: Normal rate and regular rhythm.     Heart sounds: Normal heart sounds. No murmur. No friction rub. No gallop.   Pulmonary:     Effort: Pulmonary effort is normal. No respiratory distress.     Breath sounds: Normal breath sounds. No wheezing or rales.  Abdominal:     General: Bowel sounds are normal.     Palpations: Abdomen is soft. There is no mass.     Tenderness: There is no abdominal tenderness. There is no guarding or rebound.  Musculoskeletal:        General: No tenderness. Normal range of motion.     Cervical back: Normal range of motion and neck supple.  Lymphadenopathy:     Cervical: No cervical adenopathy.  Skin:    General: Skin is warm.     Findings: No rash.  Neurological:     Mental Status: She is alert and oriented to person, place, and time.     Cranial Nerves: No cranial nerve deficit.     Deep Tendon Reflexes: Reflexes normal.    Psychiatric:        Mood and Affect: Mood is not anxious or depressed.     Wt Readings from Last 3 Encounters:  03/15/20 143 lb (64.9 kg)  12/02/19 141 lb (64 kg)  09/17/19 142 lb (64.4 kg)    BP 140/80   Pulse 60   Ht 5' 3.5" (1.613 m)   Wt 143 lb (64.9 kg)   BMI 24.93 kg/m   Assessment and Plan: 1. Elevated blood pressure, situational Chronic.  Episodic.  Relatively stable.  Patient has normal readings per her own readings at home.  Patient has noted to have some mild elevation at the clinic it is unlikely that the patient will take any medication at this  time and we will concentrate more on decreasing sodium intake and maintaining current weight.  We will check renal function panel to assess electrolytes and GFR. - Renal Function Panel  2. Other idiopathic scoliosis, lumbar region Chronic.  Controlled.  Stable.  Patient been told there is nothing else that can be done although that there is a concerned about for persistence of the pain.  Patient currently is able to control with current regimen of over-the-counter medications.  3. Abnormal gait Patient has been noted to have a shuffling gait which she has been told might be some level of parkinsonism but she refuses to be further evaluated at neurologic level.  We will continue to watch.  4. History of hyperlipidemia Patient has a history of hyperlipidemia but refuses to take any medications for control in the past and is unlikely to take any medications for future control of lipids.  We will encourage low-cholesterol diet. - Lipid Panel With LDL/HDL Ratio  5. History of anemia Patient has history of anemia we will check a CBC. - CBC with Differential/Platelet

## 2020-03-16 ENCOUNTER — Other Ambulatory Visit: Payer: Self-pay

## 2020-03-16 DIAGNOSIS — E782 Mixed hyperlipidemia: Secondary | ICD-10-CM

## 2020-03-16 LAB — CBC WITH DIFFERENTIAL/PLATELET
Basophils Absolute: 0 10*3/uL (ref 0.0–0.2)
Basos: 1 %
EOS (ABSOLUTE): 0.1 10*3/uL (ref 0.0–0.4)
Eos: 1 %
Hematocrit: 38.5 % (ref 34.0–46.6)
Hemoglobin: 13 g/dL (ref 11.1–15.9)
Immature Grans (Abs): 0 10*3/uL (ref 0.0–0.1)
Immature Granulocytes: 0 %
Lymphocytes Absolute: 1.4 10*3/uL (ref 0.7–3.1)
Lymphs: 33 %
MCH: 32.8 pg (ref 26.6–33.0)
MCHC: 33.8 g/dL (ref 31.5–35.7)
MCV: 97 fL (ref 79–97)
Monocytes Absolute: 0.4 10*3/uL (ref 0.1–0.9)
Monocytes: 9 %
Neutrophils Absolute: 2.4 10*3/uL (ref 1.4–7.0)
Neutrophils: 56 %
Platelets: 191 10*3/uL (ref 150–450)
RBC: 3.96 x10E6/uL (ref 3.77–5.28)
RDW: 12.7 % (ref 11.7–15.4)
WBC: 4.2 10*3/uL (ref 3.4–10.8)

## 2020-03-16 LAB — LIPID PANEL WITH LDL/HDL RATIO
Cholesterol, Total: 213 mg/dL — ABNORMAL HIGH (ref 100–199)
HDL: 69 mg/dL (ref >39)
LDL Chol Calc (NIH): 133 mg/dL — ABNORMAL HIGH (ref 0–99)
LDL/HDL Ratio: 1.9 ratio (ref 0.0–3.2)
Triglycerides: 64 mg/dL (ref 0–149)
VLDL Cholesterol Cal: 11 mg/dL (ref 5–40)

## 2020-03-16 LAB — RENAL FUNCTION PANEL
Albumin: 4.8 g/dL — ABNORMAL HIGH (ref 3.6–4.6)
BUN/Creatinine Ratio: 31 — ABNORMAL HIGH (ref 12–28)
BUN: 24 mg/dL (ref 8–27)
CO2: 22 mmol/L (ref 20–29)
Calcium: 9.3 mg/dL (ref 8.7–10.3)
Chloride: 104 mmol/L (ref 96–106)
Creatinine, Ser: 0.77 mg/dL (ref 0.57–1.00)
GFR calc Af Amer: 83 mL/min/{1.73_m2} (ref >59)
GFR calc non Af Amer: 72 mL/min/{1.73_m2} (ref >59)
Glucose: 84 mg/dL (ref 65–99)
Phosphorus: 3.7 mg/dL (ref 3.0–4.3)
Potassium: 4.7 mmol/L (ref 3.5–5.2)
Sodium: 141 mmol/L (ref 134–144)

## 2020-03-16 MED ORDER — EZETIMIBE 10 MG PO TABS: 10.0000 mg | ORAL_TABLET | Freq: Every day | ORAL | 1 refills | Status: DC

## 2020-03-16 NOTE — Progress Notes (Unsigned)
Sent in Zetia to [facility]

## 2020-04-12 ENCOUNTER — Telehealth: Payer: Self-pay | Admitting: Family Medicine

## 2020-04-12 NOTE — Telephone Encounter (Signed)
Scheduled for tomorrow.

## 2020-04-12 NOTE — Telephone Encounter (Unsigned)
Copied from CRM [id number]. Topic: General - Other >> Apr 12, 2020  8:45 AM Gwenlyn Fudge wrote: Reason for CRM: Pt called stating that she would like to speak with Delice Bison. Pt states that four weeks ago she was seen in the office and given a new medication. Pt states that she has lost weight rapidly due to the medication and is down to 138. Pt is concerned. Please advise.

## 2020-04-13 ENCOUNTER — Encounter: Payer: Self-pay | Admitting: Family Medicine

## 2020-04-13 ENCOUNTER — Ambulatory Visit
Admission: RE | Admit: 2020-04-13 | Discharge: 2020-04-13 | Disposition: A | Discharge status: Home / Self Care | Payer: Medicare Other | Attending: Family Medicine | Admitting: Family Medicine

## 2020-04-13 ENCOUNTER — Other Ambulatory Visit: Payer: Self-pay

## 2020-04-13 ENCOUNTER — Ambulatory Visit (INDEPENDENT_AMBULATORY_CARE_PROVIDER_SITE_OTHER): Payer: Medicare Other | Admitting: Family Medicine

## 2020-04-13 ENCOUNTER — Ambulatory Visit
Admission: RE | Admit: 2020-04-13 | Discharge: 2020-04-13 | Disposition: A | Discharge status: Home / Self Care | Payer: Medicare Other | Source: Ambulatory Visit | Attending: Family Medicine | Admitting: Family Medicine

## 2020-04-13 VITALS — BP 120/60 | HR 64 | Ht 63.5 in | Wt 140.0 lb

## 2020-04-13 DIAGNOSIS — R0989 Other specified symptoms and signs involving the circulatory and respiratory systems: Secondary | ICD-10-CM | POA: Insufficient documentation

## 2020-04-13 DIAGNOSIS — R634 Abnormal weight loss: Secondary | ICD-10-CM | POA: Diagnosis present

## 2020-04-13 DIAGNOSIS — R1900 Intra-abdominal and pelvic swelling, mass and lump, unspecified site: Secondary | ICD-10-CM

## 2020-04-13 DIAGNOSIS — R109 Unspecified abdominal pain: Secondary | ICD-10-CM

## 2020-04-13 NOTE — Progress Notes (Signed)
Date:  04/13/2020   Name:  Amy Shah   DOB:  25-Jul-1935   MRN:  [id number]   Chief Complaint: Weight Loss (for past 1 1/2 pat has stayed in the 140s- her scales at home are showing less.)  Patient is a 85 year old female who presents for a unintentional weight loss exam. The patient reports the following problems: as noted. Health maintenance has been reviewed up to date.  Flank Pain This is a new problem. The current episode started 1 to 4 weeks ago (2weeks). The problem occurs daily. The problem has been waxing and waning since onset. Pain location: left lateral. Pertinent negatives include no abdominal pain, bladder incontinence, bowel incontinence, chest pain, dysuria, fever, headaches, numbness, pelvic pain or weakness.    Lab Results  Component Value Date   CREATININE 0.77 03/15/2020   BUN 24 03/15/2020   NA 141 03/15/2020   K 4.7 03/15/2020   CL 104 03/15/2020   CO2 22 03/15/2020   Lab Results  Component Value Date   CHOL 213 (H) 03/15/2020   HDL 69 03/15/2020   LDLCALC 133 (H) 03/15/2020   TRIG 64 03/15/2020   No results found for: TSH No results found for: HGBA1C Lab Results  Component Value Date   WBC 4.2 03/15/2020   HGB 13.0 03/15/2020   HCT 38.5 03/15/2020   MCV 97 03/15/2020   PLT 191 03/15/2020   No results found for: ALT, AST, GGT, ALKPHOS, BILITOT   Review of Systems  Constitutional: Positive for activity change, fatigue and unexpected weight change. Negative for appetite change, chills, diaphoresis and fever.  HENT: Negative for congestion, ear discharge, ear pain, rhinorrhea, sinus pressure, sneezing and sore throat.   Eyes: Negative for photophobia, pain, discharge, redness and itching.  Respiratory: Negative for cough, shortness of breath, wheezing and stridor.   Cardiovascular: Negative for chest pain, palpitations and leg swelling.  Gastrointestinal: Negative for abdominal pain, anal bleeding, blood in stool, bowel incontinence,  constipation, diarrhea, nausea and vomiting.  Endocrine: Negative for cold intolerance, heat intolerance, polydipsia, polyphagia and polyuria.  Genitourinary: Negative for bladder incontinence, dysuria, flank pain, frequency, hematuria, menstrual problem, pelvic pain, urgency, vaginal bleeding and vaginal discharge.       Urinary incontinence  Musculoskeletal: Negative for arthralgias, back pain and myalgias.  Skin: Negative for rash.  Allergic/Immunologic: Negative for environmental allergies and food allergies.  Neurological: Negative for dizziness, weakness, light-headedness, numbness and headaches.  Hematological: Negative for adenopathy. Does not bruise/bleed easily.  Psychiatric/Behavioral: Negative for dysphoric mood. The patient is not nervous/anxious.     Patient Active Problem List   Diagnosis Date Noted  . Osteoarthritis of hip 08/28/2018  . Degeneration of lumbar intervertebral disc 10/31/2017  . History of total hip arthroplasty 10/31/2017  . Sacroiliac joint pain 10/31/2017  . Scoliosis of lumbar spine 10/31/2017  . Risk for falls 03/22/2017    Allergies  Allergen Reactions  . Codeine Nausea Only and Other (See Comments)    Dizzy and nausea  . Tetanus Toxoids Swelling and Other (See Comments)    Swelling and fever  . Sulfa Antibiotics Rash    Past Surgical History:  Procedure Laterality Date  . ABDOMINAL HYSTERECTOMY     partial/ left cervix  . CATARACT EXTRACTION    . COLON SURGERY     colectomy- removed polyp in cecrum- benign  . JOINT REPLACEMENT Left    hip  . TONSILLECTOMY      Social History   Tobacco  Use  . Smoking status: Never Smoker  . Smokeless tobacco: Never Used  Substance Use Topics  . Alcohol use: Yes    Alcohol/week: 1.0 standard drink    Types: 1 Glasses of wine per week    Comment: once a year  . Drug use: No     Medication list has been reviewed and updated.  Current Meds  Medication Sig  . acetaminophen (TYLENOL) 500 MG  tablet Take 1,000 mg by mouth 2 (two) times daily.  Marland Kitchen albuterol (PROAIR HFA) 108 (90 Base) MCG/ACT inhaler Inhale 1 puff into the lungs 2 (two) times a week.  . Cholecalciferol (VITAMIN D-3) 25 MCG (1000 UT) CAPS Take 1 capsule by mouth daily.  Marland Kitchen ezetimibe (ZETIA) 10 MG tablet Take 1 tablet (10 mg total) by mouth daily.  . Ibuprofen (ADVIL) 200 MG CAPS Take by mouth.  . Multiple Vitamins-Minerals (PRESERVISION AREDS PO) Take 2 tablets by mouth daily.    PHQ 2/9 Scores 04/13/2020 03/15/2020 12/02/2019 09/17/2019  PHQ - 2 Score 0 0 0 0  PHQ- 9 Score 0 0 0 0    GAD 7 : Generalized Anxiety Score 04/13/2020 03/15/2020 12/02/2019  Nervous, Anxious, on Edge 0 0 0  Control/stop worrying 0 0 0  Worry too much - different things 0 0 0  Trouble relaxing 0 0 0  Restless 0 0 0  Easily annoyed or irritable 0 0 0  Afraid - awful might happen 0 0 0  Total GAD 7 Score 0 0 0    BP Readings from Last 3 Encounters:  04/13/20 120/60  03/15/20 138/80  12/02/19 126/70    Physical Exam Vitals and nursing note reviewed.  Constitutional:      Appearance: She is well-developed.  HENT:     Head: Normocephalic.     Right Ear: Tympanic membrane, ear canal and external ear normal.     Left Ear: Tympanic membrane, ear canal and external ear normal.     Nose: Nose normal.  Eyes:     General: Lids are everted, no foreign bodies appreciated. No scleral icterus.       Left eye: No foreign body or hordeolum.     Conjunctiva/sclera: Conjunctivae normal.     Right eye: Right conjunctiva is not injected.     Left eye: Left conjunctiva is not injected.     Pupils: Pupils are equal, round, and reactive to light.  Neck:     Thyroid: No thyromegaly.     Vascular: No JVD.     Trachea: No tracheal deviation.  Cardiovascular:     Rate and Rhythm: Normal rate and regular rhythm.     Pulses: Normal pulses.     Heart sounds: Normal heart sounds, S1 normal and S2 normal. No murmur heard.  No systolic murmur is present.   No diastolic murmur is present.  No friction rub. No gallop. No S3 or S4 sounds.   Pulmonary:     Effort: Pulmonary effort is normal. No respiratory distress.     Breath sounds: Normal breath sounds. No wheezing or rales.  Abdominal:     General: Bowel sounds are normal.     Palpations: Abdomen is soft. There is pulsatile mass. There is no hepatomegaly, splenomegaly or mass.     Tenderness: There is abdominal tenderness in the right upper quadrant. There is no guarding or rebound.    Musculoskeletal:        General: No tenderness. Normal range of motion.  Cervical back: Normal range of motion and neck supple.  Lymphadenopathy:     Cervical: No cervical adenopathy.  Skin:    General: Skin is warm.     Findings: No rash.  Neurological:     Mental Status: She is alert and oriented to person, place, and time.     Cranial Nerves: No cranial nerve deficit.     Deep Tendon Reflexes: Reflexes normal.  Psychiatric:        Mood and Affect: Mood is not anxious or depressed.     Wt Readings from Last 3 Encounters:  04/13/20 140 lb (63.5 kg)  03/15/20 143 lb (64.9 kg)  12/02/19 141 lb (64 kg)    BP 120/60   Pulse 64   Ht 5' 3.5" (1.613 m)   Wt 140 lb (63.5 kg)   BMI 24.41 kg/m   Assessment and Plan:  1. Unintentional weight loss New onset.  Patient has had a 4 pound weight loss since last seen in patient states that she has not tried to lose weight and has not been increasing activity level.  Given that on examination it is noted that she had a pleural friction rub and we will get a chest x-ray.  Abdominal exam noted there was a pulsatile mass in the left mid quadrant. - DG Chest 2 View; Future - US Abdomen Complete; Future - US Pelvis Complete; Future - CBC with Differential/Platelet - Comprehensive metabolic panel - Thyroid Panel With TSH  2. Pleural rub On examination he was noted to have a pleural friction rub in the left posterior lobe.  We will obtain a chest x-ray to  evaluate. - DG Chest 2 View; Future  3. Abdominal pulsatile mass On examination patient has had a tender pulsatile mass in the mid left quadrant with some tenderness.  We will obtain an ultrasound of the abdomen and pelvis given her weight loss and rule out possibility of aneurysm or other masslike circumstance. - US Abdomen Complete; Future - US Pelvis Complete; Future  4. Left flank pain Patient also has had some left thoracic abdominal flank type pain.  Given that she has not had a hysterectomy or oophorectomy we will obtain a pelvic ultrasound to evaluate given her perceived weight loss. - US Pelvis Complete; Future

## 2020-04-14 LAB — CBC WITH DIFFERENTIAL/PLATELET
Basophils Absolute: 0 10*3/uL (ref 0.0–0.2)
Basos: 1 %
EOS (ABSOLUTE): 0.1 10*3/uL (ref 0.0–0.4)
Eos: 1 %
Hematocrit: 39.2 % (ref 34.0–46.6)
Hemoglobin: 13.2 g/dL (ref 11.1–15.9)
Immature Grans (Abs): 0 10*3/uL (ref 0.0–0.1)
Immature Granulocytes: 0 %
Lymphocytes Absolute: 1.2 10*3/uL (ref 0.7–3.1)
Lymphs: 27 %
MCH: 32.9 pg (ref 26.6–33.0)
MCHC: 33.7 g/dL (ref 31.5–35.7)
MCV: 98 fL — ABNORMAL HIGH (ref 79–97)
Monocytes Absolute: 0.5 10*3/uL (ref 0.1–0.9)
Monocytes: 10 %
Neutrophils Absolute: 2.7 10*3/uL (ref 1.4–7.0)
Neutrophils: 61 %
Platelets: 185 10*3/uL (ref 150–450)
RBC: 4.01 x10E6/uL (ref 3.77–5.28)
RDW: 12.9 % (ref 11.7–15.4)
WBC: 4.5 10*3/uL (ref 3.4–10.8)

## 2020-04-14 LAB — COMPREHENSIVE METABOLIC PANEL
ALT: 18 IU/L (ref 0–32)
AST: 26 IU/L (ref 0–40)
Albumin/Globulin Ratio: 2.4 — ABNORMAL HIGH (ref 1.2–2.2)
Albumin: 4.8 g/dL — ABNORMAL HIGH (ref 3.6–4.6)
Alkaline Phosphatase: 95 IU/L (ref 48–121)
BUN/Creatinine Ratio: 26 (ref 12–28)
BUN: 20 mg/dL (ref 8–27)
Bilirubin Total: 0.5 mg/dL (ref 0.0–1.2)
CO2: 23 mmol/L (ref 20–29)
Calcium: 9.6 mg/dL (ref 8.7–10.3)
Chloride: 103 mmol/L (ref 96–106)
Creatinine, Ser: 0.78 mg/dL (ref 0.57–1.00)
GFR calc Af Amer: 81 mL/min/{1.73_m2} (ref >59)
GFR calc non Af Amer: 71 mL/min/{1.73_m2} (ref >59)
Globulin, Total: 2 g/dL (ref 1.5–4.5)
Glucose: 86 mg/dL (ref 65–99)
Potassium: 4.7 mmol/L (ref 3.5–5.2)
Sodium: 140 mmol/L (ref 134–144)
Total Protein: 6.8 g/dL (ref 6.0–8.5)

## 2020-04-14 LAB — THYROID PANEL WITH TSH
Free Thyroxine Index: 2.1 (ref 1.2–4.9)
T3 Uptake Ratio: 26 % (ref 24–39)
T4, Total: 7.9 ug/dL (ref 4.5–12.0)
TSH: 2.4 u[IU]/mL (ref 0.450–4.500)

## 2020-04-21 ENCOUNTER — Ambulatory Visit
Admission: RE | Admit: 2020-04-21 | Discharge: 2020-04-21 | Disposition: A | Discharge status: Home / Self Care | Payer: Medicare Other | Source: Ambulatory Visit | Attending: Family Medicine | Admitting: Family Medicine

## 2020-04-21 ENCOUNTER — Other Ambulatory Visit: Payer: Self-pay

## 2020-04-21 DIAGNOSIS — R634 Abnormal weight loss: Secondary | ICD-10-CM

## 2020-04-21 DIAGNOSIS — R1900 Intra-abdominal and pelvic swelling, mass and lump, unspecified site: Secondary | ICD-10-CM | POA: Diagnosis not present

## 2020-04-21 DIAGNOSIS — R10A2 Flank pain, left side: Secondary | ICD-10-CM

## 2020-04-21 DIAGNOSIS — R109 Unspecified abdominal pain: Secondary | ICD-10-CM | POA: Diagnosis not present

## 2020-04-21 DIAGNOSIS — Z90710 Acquired absence of both cervix and uterus: Secondary | ICD-10-CM | POA: Diagnosis not present

## 2020-04-22 ENCOUNTER — Other Ambulatory Visit: Payer: Self-pay

## 2020-04-22 DIAGNOSIS — E782 Mixed hyperlipidemia: Secondary | ICD-10-CM

## 2020-04-22 MED ORDER — EZETIMIBE 10 MG PO TABS: 10.0000 mg | ORAL_TABLET | Freq: Every day | ORAL | 1 refills | Status: DC

## 2020-04-29 DIAGNOSIS — H353132 Nonexudative age-related macular degeneration, bilateral, intermediate dry stage: Secondary | ICD-10-CM | POA: Diagnosis not present

## 2020-07-05 DIAGNOSIS — Z23 Encounter for immunization: Secondary | ICD-10-CM | POA: Diagnosis not present

## 2020-07-21 IMAGING — CR DG CHEST 2V
2 series · 2 of 2 positions shown · non-contrast
Comparison: None.

CLINICAL DATA: Decreased right lower lung breath sounds. Small
airways disease. History of bronchiectasis.

EXAM:
CHEST - 2 VIEW

[chest pa]
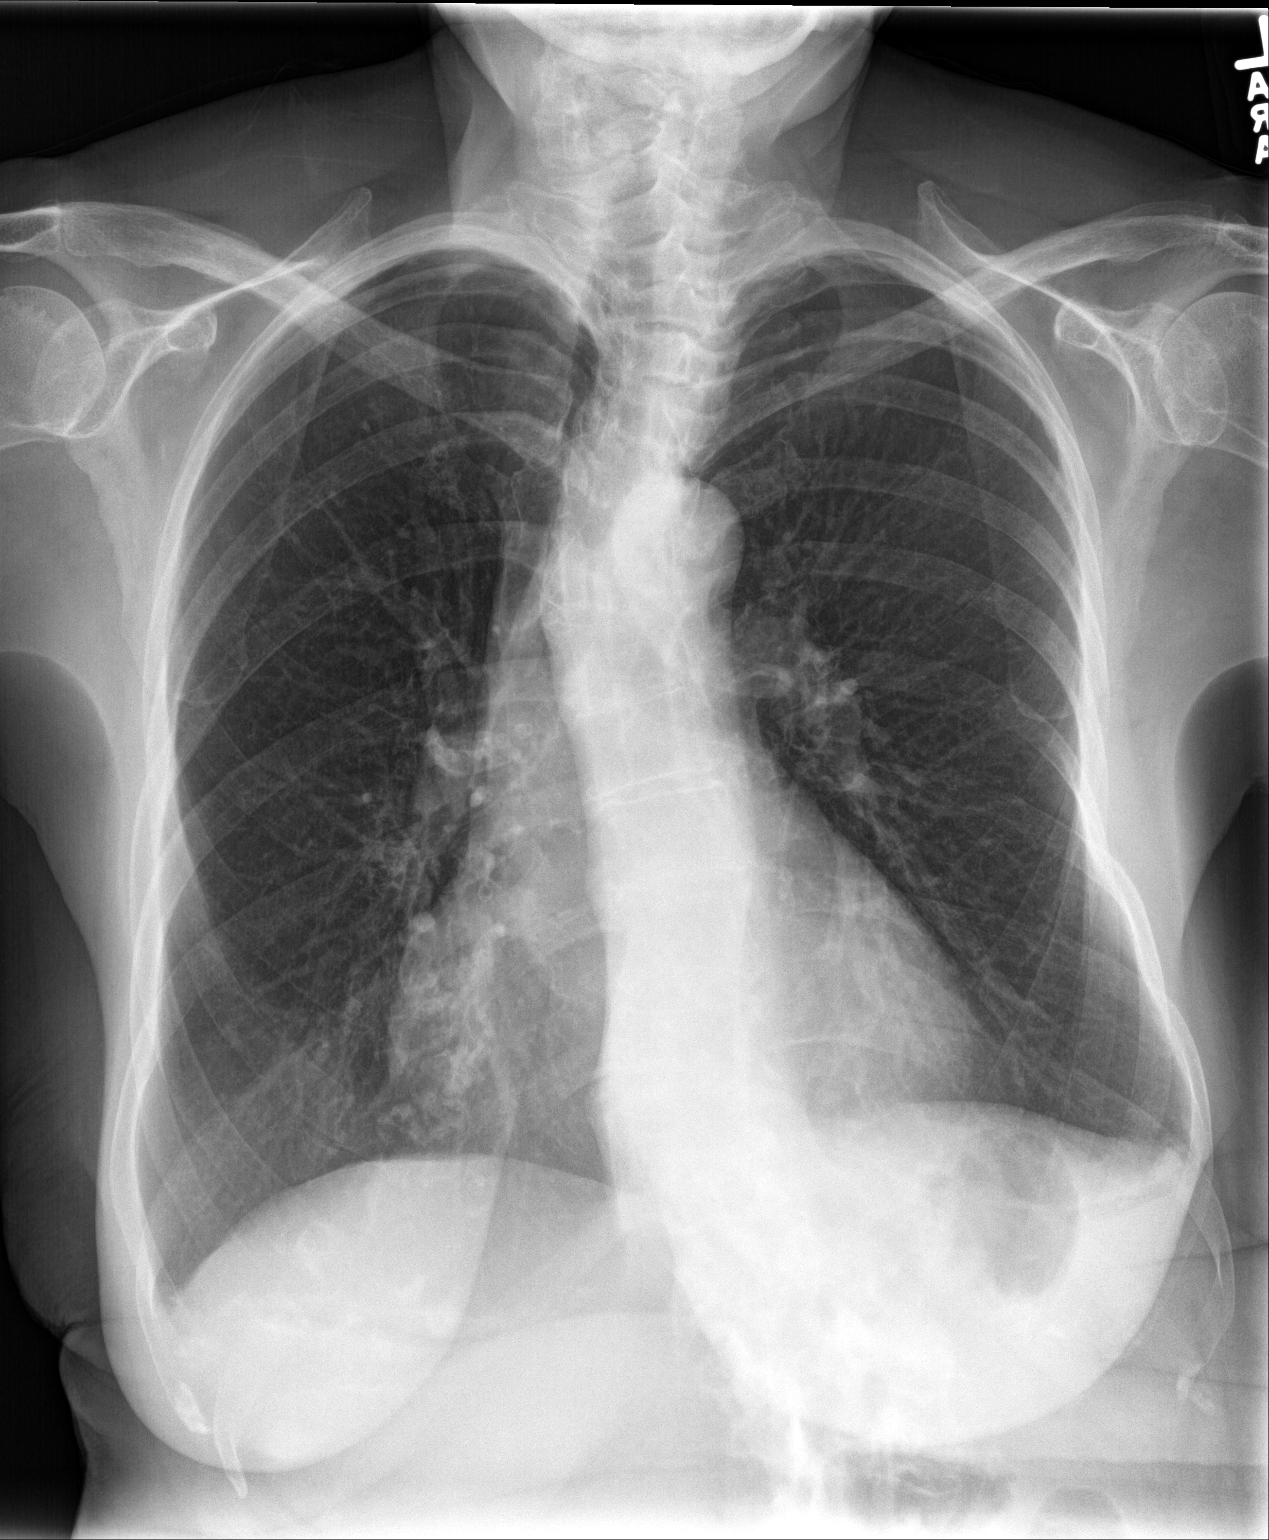

[chest lat]
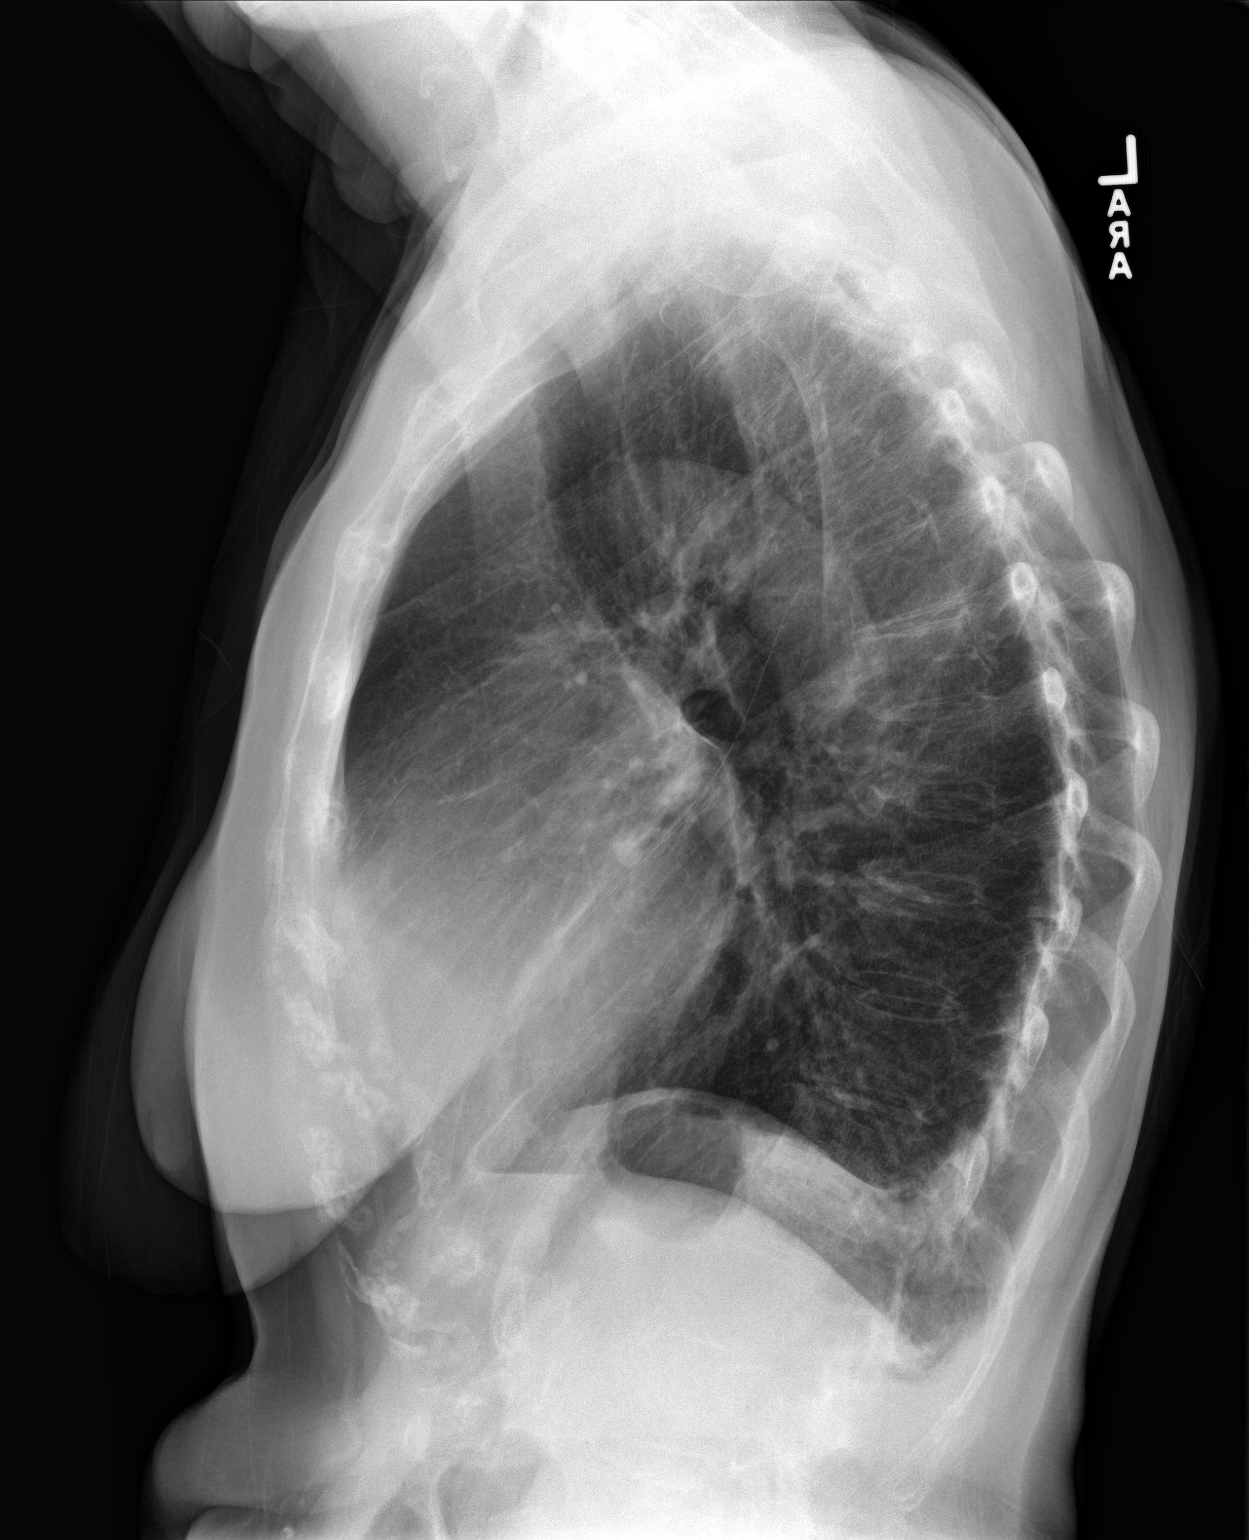

[2 of 2 positions shown; findings below may reference images not displayed]

FINDINGS: The cardiomediastinal silhouette is within normal limits. The lungs
are mildly hyperinflated. No confluent airspace opacity, edema,
pleural effusion, or pneumothorax is identified. There is mild to
moderate thoracic dextroscoliosis.
IMPRESSION: No evidence of active cardiopulmonary disease.

## 2020-07-25 ENCOUNTER — Ambulatory Visit (INDEPENDENT_AMBULATORY_CARE_PROVIDER_SITE_OTHER): Payer: Medicare Other

## 2020-07-25 ENCOUNTER — Other Ambulatory Visit: Payer: Self-pay | Admitting: Family Medicine

## 2020-07-25 DIAGNOSIS — J984 Other disorders of lung: Secondary | ICD-10-CM

## 2020-07-25 DIAGNOSIS — Z0000 Encounter for general adult medical examination without abnormal findings: Secondary | ICD-10-CM | POA: Diagnosis not present

## 2020-07-25 NOTE — Progress Notes (Signed)
Subjective:   Amy Shah is a 85 y.o. female who presents for an Initial Medicare Annual Wellness Visit.  Virtual Visit via Telephone Note  I connected with  Amy Shah on 07/25/20 at  2:00 PM EDT by telephone and verified that I am speaking with the correct person using two identifiers.  Medicare Annual Wellness visit completed telephonically due to Covid-19 pandemic.   Location: Patient: home Provider: [facility]   I discussed the limitations, risks, security and privacy concerns of performing an evaluation and management service by telephone and the availability of in person appointments. The patient expressed understanding and agreed to proceed.  Unable to perform video visit due to video visit attempted and failed and/or patient does not have video capability.   Some vital signs may be absent or patient reported.   Amy Littler, LPN    Review of Systems     Cardiac Risk Factors include: advanced age (>57men, >58 women);dyslipidemia     Objective:    There were no vitals filed for this visit. There is no height or weight on file to calculate BMI.  Advanced Directives 07/25/2020 03/26/2015  Does Patient Have a Medical Advance Directive? Yes Yes  Type of Estate agent of [place];Living will Healthcare Power of [place];Living will  Copy of Healthcare Power of Attorney in Chart? No - copy requested Yes    Current Medications (verified) Outpatient Encounter Medications as of 07/25/2020  Medication Sig  . acetaminophen (TYLENOL) 500 MG tablet Take 1,000 mg by mouth 2 (two) times daily.  Marland Kitchen albuterol (VENTOLIN HFA) 108 (90 Base) MCG/ACT inhaler INHALE 1 PUFF INTO THE LUNGS 2 TIMES A WEEK  . Cholecalciferol (VITAMIN D-3) 25 MCG (1000 UT) CAPS Take 1 capsule by mouth daily.  Marland Kitchen ezetimibe (ZETIA) 10 MG tablet Take 1 tablet (10 mg total) by mouth daily.  . Ibuprofen (ADVIL) 200 MG CAPS Take by mouth.  . Multiple Vitamins-Minerals (PRESERVISION AREDS PO)  Take 2 tablets by mouth daily.   No facility-administered encounter medications on file as of 07/25/2020.    Allergies (verified) Codeine, Methocarbamol, Tetanus toxoids, and Sulfa antibiotics   History: Past Medical History:  Diagnosis Date  . Back pain   . Hyperlipidemia   . 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 Lung abnormality    Right middle lobe syndrome  . Macular degeneration   . Shingles   . Vertigo    Past Surgical History:  Procedure Laterality Date  . ABDOMINAL HYSTERECTOMY     partial/ left cervix  . CATARACT EXTRACTION    . COLON SURGERY     colectomy- removed polyp in cecrum- benign  . JOINT REPLACEMENT Left    hip  . TONSILLECTOMY     Family History  Problem Relation Age of Onset  . Cancer Mother   . Stroke Father    Social History   Socioeconomic History  . Marital status: Married    Spouse name: Not on file  . Number of children: Not on file  . Years of education: Not on file  . Highest education level: Not on file  Occupational History  . Not on file  Tobacco Use  . Smoking status: Never Smoker  . Smokeless tobacco: Never Used  Substance and Sexual Activity  . Alcohol use: Yes    Alcohol/week: 1.0 standard drink    Types: 1 Glasses of wine per week    Comment: once a year  . Drug  use: No  . Sexual activity: Not Currently  Other Topics Concern  . Not on file  Social History Narrative  . Not on file   Social Determinants of Health   Financial Resource Strain: Low Risk   . Difficulty of Paying Living Expenses: Not hard at all  Food Insecurity: No Food Insecurity  . Worried About Programme researcher, broadcasting/film/videounning Out of Food in the Last Year: Never true  . Ran Out of Food in the Last Year: Never true  Transportation Needs: No Transportation Needs  . Lack of Transportation (Medical): No  . Lack of Transportation (Non-Medical): No  Physical Activity: Inactive  . Days of Exercise per Week: 0 days  . Minutes of Exercise per Session:  0 min  Stress: No Stress Concern Present  . Feeling of Stress : Only a little  Social Connections: Moderately Isolated  . Frequency of Communication with Friends and Family: More than three times a week  . Frequency of Social Gatherings with Friends and Family: More than three times a week  . Attends Religious Services: Never  . Active Member of Clubs or Organizations: No  . Attends BankerClub or Organization Meetings: Never  . Marital Status: Married    Tobacco Counseling Counseling given: Not Answered   Clinical Intake:  Pre-visit preparation completed: Yes  Pain : No/denies pain     Nutritional Risks: None Diabetes: No  Shah often do you need to have someone help you when you read instructions, pamphlets, or other written materials from your doctor or pharmacy?: 1 - Never    Interpreter Needed?: No  Information entered by :: Amy LittlerKasey Tammie Yanda LPN   Activities of Daily Living In your present state of health, do you have any difficulty performing the following activities: 07/25/2020  Hearing? N  Comment declines hearing aids  Vision? N  Difficulty concentrating or making decisions? N  Walking or climbing stairs? Y  Dressing or bathing? N  Doing errands, shopping? Y  Comment does not Engineer, manufacturingdrive  Preparing Food and eating ? N  Using the Toilet? N  In the past six months, have you accidently leaked urine? Y  Comment wears pads/depends at night for protection  Do you have problems with loss of bowel control? N  Managing your Medications? N  Managing your Finances? N  Housekeeping or managing your Housekeeping? N  Some recent data might be hidden    Patient Care Team: Amy LimerickJones, Amy C, MD as PCP - General (Family Medicine)  Indicate any recent Medical Services you may have received from other than [facility] providers in the past year (date may be approximate).     Assessment:   This is a routine wellness examination for Amy Shah.  Hearing/Vision screen  Hearing Screening    125Hz  250Hz  500Hz  1000Hz  2000Hz  3000Hz  4000Hz  6000Hz  8000Hz   Right ear:           Left ear:           Comments: Pt denies hearing difficulty  Vision Screening Comments: [facility]  Dietary issues and exercise activities discussed: Current Exercise Habits: The patient does not participate in regular exercise at present, Exercise limited by: orthopedic condition(s)  Goals    . DIET - INCREASE WATER INTAKE     Recommend drinking 6-8 glasses of water per day       Depression Screen PHQ 2/9 Scores 07/25/2020 04/13/2020 03/15/2020 12/02/2019 09/17/2019 03/04/2019 08/28/2018  PHQ - 2 Score 0 0 0 0 0 0 0  PHQ- 9 Score - 0 0 0 0 0 0    Fall Risk Fall Risk  07/25/2020 04/13/2020 03/15/2020 12/02/2019 03/04/2019  Falls in the past year? 0 0 0 0 0  Number falls in past yr: 0 - - - 0  Injury with Fall? 0 - - - 0  Risk for fall due to : Impaired balance/gait;Impaired vision - Impaired balance/gait;Impaired mobility - -  Follow up Falls prevention discussed Falls evaluation completed Falls evaluation completed Falls evaluation completed -    Any stairs in or around the home? Yes  If so, are there any without handrails? No  Home free of loose throw rugs in walkways, pet beds, electrical cords, etc? Yes  Adequate lighting in your home to reduce risk of falls? Yes   ASSISTIVE DEVICES UTILIZED TO PREVENT FALLS:  Life alert? No  Use of a cane, walker or w/Shah? Yes  Grab bars in the bathroom? Yes  Shower chair or bench in shower? No  Elevated toilet seat or a handicapped toilet? Yes   TIMED UP AND GO:  Was the test performed? No . Telephonic visit.   Cognitive Function:     6CIT Screen 07/25/2020  What Year? 0 points  What month? 0 points  What time? 0 points  Count back from 20 0 points  Months in reverse 0 points  Repeat phrase 0 points  Total Score 0    Immunizations Immunization History  Administered Date(s) Administered  . Influenza,  High Dose Seasonal PF 07/06/2015, 07/02/2016, 06/17/2019, 06/25/2020  . Influenza,inj,Quad PF,6+ Mos 07/04/2018  . Influenza-Unspecified 09/15/2008, 07/01/2017  . PFIZER SARS-COV-2 Vaccination 01/01/2020, 01/22/2020  . Pneumococcal Conjugate-13 02/15/2015  . Pneumococcal Polysaccharide-23 10/08/2010    Tdap staus: n/a allergic  Flu Vaccine status: Up to date   Pneumococcal vaccine status: Up to date   Covid-19 vaccine status: Completed vaccines  Qualifies for Shingles Vaccine? Yes   Zostavax completed No   Shingrix Completed?: No.    Education has been provided regarding the importance of this vaccine. Patient has been advised to call insurance company to determine out of pocket expense if they have not yet received this vaccine. Advised may also receive vaccine at local pharmacy or Health Dept. Verbalized acceptance and understanding.  Screening Tests Health Maintenance  Topic Date Due  . DEXA SCAN  09/16/2020 (Originally 01/09/2002)  . TETANUS/TDAP  09/16/2020 (Originally 01/10/1954)  . INFLUENZA VACCINE  Completed  . COVID-19 Vaccine  Completed  . PNA vac Low Risk Adult  Completed    Health Maintenance  There are no preventive care reminders to display for this patient.   Colorectal cancer screening: No longer required.    Mammogram status: No longer required.    Bone Density screening status: no longer required  Lung Cancer Screening: (Low Dose CT Chest recommended if Age 63-80 years, 30 pack-year currently smoking OR have quit w/in 15years.) does not qualify.   Additional Screening:  Hepatitis Shah Screening: does not qualify  Vision Screening: Recommended annual ophthalmology exams for early detection of glaucoma and other disorders of the eye. Is the patient up to date with their annual eye exam?  Yes  Who is the provider or what is the name of the office in which the patient attends annual eye exams? [facility] Dr[facility] Brooke Dare  Dental Screening: Recommended annual  dental exams for proper oral hygiene  Community Resource Referral / Chronic Care Management: CRR required this visit?  No   CCM required this visit?  No      Plan:     I have personally reviewed and noted the following in the patient's chart:   . Medical and social history . Use of alcohol, tobacco or illicit drugs  . Current medications and supplements . Functional ability and status . Nutritional status . Physical activity . Advanced directives . List of other physicians . Hospitalizations, surgeries, and ER visits in previous 12 months . Vitals . Screenings to include cognitive, depression, and falls . Referrals and appointments  In addition, I have reviewed and discussed with patient certain preventive protocols, quality metrics, and best practice recommendations. A written personalized care plan for preventive services as well as general preventive health recommendations were provided to patient.     Amy Littler, LPN   02/77/[id number]   Nurse Notes: none

## 2020-07-25 NOTE — Patient Instructions (Signed)
[name] , Thank you for taking time to come for your Medicare Wellness Visit. I appreciate your ongoing commitment to your health goals. Please review the following plan we discussed and let me know if I can assist you in the future.   Screening recommendations/referrals: Colonoscopy: no longer required Mammogram: no longer required Bone Density: no longer required Recommended yearly ophthalmology/optometry visit for glaucoma screening and checkup Recommended yearly dental visit for hygiene and checkup  Vaccinations: Influenza vaccine: done 06/25/20 Pneumococcal vaccine: done 02/15/15 Tdap vaccine: n/a allergic Shingles vaccine: declined   Covid-19:done 01/01/20 & 01/22/20  Advanced directives: Please bring a copy of your health care power of attorney and living will to the office at your convenience.  Conditions/risks identified: Recommend drinking 6-8 glasses of water per day   Next appointment: Follow up in one year for your annual wellness visit    Preventive Care 65 Years and Older, Female Preventive care refers to lifestyle choices and visits with your health care provider that can promote health and wellness. What does preventive care include?  A yearly physical exam. This is also called an annual well check.  Dental exams once or twice a year.  Routine eye exams. Ask your health care provider how often you should have your eyes checked.  Personal lifestyle choices, including:  Daily care of your teeth and gums.  Regular physical activity.  Eating a healthy diet.  Avoiding tobacco and drug use.  Limiting alcohol use.  Practicing safe sex.  Taking low-dose aspirin every day.  Taking vitamin and mineral supplements as recommended by your health care provider. What happens during an annual well check? The services and screenings done by your health care provider during your annual well check will depend on your age, overall health, lifestyle risk factors, and  family history of disease. Counseling  Your health care provider may ask you questions about your:  Alcohol use.  Tobacco use.  Drug use.  Emotional well-being.  Home and relationship well-being.  Sexual activity.  Eating habits.  History of falls.  Memory and ability to understand (cognition).  Work and work Astronomer.  Reproductive health. Screening  You may have the following tests or measurements:  Height, weight, and BMI.  Blood pressure.  Lipid and cholesterol levels. These may be checked every 5 years, or more frequently if you are over 47 years old.  Skin check.  Lung cancer screening. You may have this screening every year starting at age 65 if you have a 30-pack-year history of smoking and currently smoke or have quit within the past 15 years.  Fecal occult blood test (FOBT) of the stool. You may have this test every year starting at age 3.  Flexible sigmoidoscopy or colonoscopy. You may have a sigmoidoscopy every 5 years or a colonoscopy every 10 years starting at age 38.  Hepatitis C blood test.  Hepatitis B blood test.  Sexually transmitted disease (STD) testing.  Diabetes screening. This is done by checking your blood sugar (glucose) after you have not eaten for a while (fasting). You may have this done every 1-3 years.  Bone density scan. This is done to screen for osteoporosis. You may have this done starting at age 36.  Mammogram. This may be done every 1-2 years. Talk to your health care provider about how often you should have regular mammograms. Talk with your health care provider about your test results, treatment options, and if necessary, the need for more tests. Vaccines  Your health care  provider may recommend certain vaccines, such as:  Influenza vaccine. This is recommended every year.  Tetanus, diphtheria, and acellular pertussis (Tdap, Td) vaccine. You may need a Td booster every 10 years.  Zoster vaccine. You may need this  after age 82.  Pneumococcal 13-valent conjugate (PCV13) vaccine. One dose is recommended after age 3.  Pneumococcal polysaccharide (PPSV23) vaccine. One dose is recommended after age 51. Talk to your health care provider about which screenings and vaccines you need and how often you need them. This information is not intended to replace advice given to you by your health care provider. Make sure you discuss any questions you have with your health care provider. Document Released: 10/21/2015 Document Revised: 06/13/2016 Document Reviewed: 07/26/2015 Elsevier Interactive Patient Education  2017 [employer] Prevention in the Home Falls can cause injuries. They can happen to people of all ages. There are many things you can do to make your home safe and to help prevent falls. What can I do on the outside of my home?  Regularly fix the edges of walkways and driveways and fix any cracks.  Remove anything that might make you trip as you walk through a door, such as a raised step or threshold.  Trim any bushes or trees on the path to your home.  Use bright outdoor lighting.  Clear any walking paths of anything that might make someone trip, such as rocks or tools.  Regularly check to see if handrails are loose or broken. Make sure that both sides of any steps have handrails.  Any raised decks and porches should have guardrails on the edges.  Have any leaves, snow, or ice cleared regularly.  Use sand or salt on walking paths during winter.  Clean up any spills in your garage right away. This includes oil or grease spills. What can I do in the bathroom?  Use night lights.  Install grab bars by the toilet and in the tub and shower. Do not use towel bars as grab bars.  Use non-skid mats or decals in the tub or shower.  If you need to sit down in the shower, use a plastic, non-slip stool.  Keep the floor dry. Clean up any water that spills on the floor as soon as it  happens.  Remove soap buildup in the tub or shower regularly.  Attach bath mats securely with double-sided non-slip rug tape.  Do not have throw rugs and other things on the floor that can make you trip. What can I do in the bedroom?  Use night lights.  Make sure that you have a light by your bed that is easy to reach.  Do not use any sheets or blankets that are too big for your bed. They should not hang down onto the floor.  Have a firm chair that has side arms. You can use this for support while you get dressed.  Do not have throw rugs and other things on the floor that can make you trip. What can I do in the kitchen?  Clean up any spills right away.  Avoid walking on wet floors.  Keep items that you use a lot in easy-to-reach places.  If you need to reach something above you, use a strong step stool that has a grab bar.  Keep electrical cords out of the way.  Do not use floor polish or wax that makes floors slippery. If you must use wax, use non-skid floor wax.  Do not have throw  rugs and other things on the floor that can make you trip. What can I do with my stairs?  Do not leave any items on the stairs.  Make sure that there are handrails on both sides of the stairs and use them. Fix handrails that are broken or loose. Make sure that handrails are as long as the stairways.  Check any carpeting to make sure that it is firmly attached to the stairs. Fix any carpet that is loose or worn.  Avoid having throw rugs at the top or bottom of the stairs. If you do have throw rugs, attach them to the floor with carpet tape.  Make sure that you have a light switch at the top of the stairs and the bottom of the stairs. If you do not have them, ask someone to add them for you. What else can I do to help prevent falls?  Wear shoes that:  Do not have high heels.  Have rubber bottoms.  Are comfortable and fit you well.  Are closed at the toe. Do not wear sandals.  If you  use a stepladder:  Make sure that it is fully opened. Do not climb a closed stepladder.  Make sure that both sides of the stepladder are locked into place.  Ask someone to hold it for you, if possible.  Clearly mark and make sure that you can see:  Any grab bars or handrails.  First and last steps.  Where the edge of each step is.  Use tools that help you move around (mobility aids) if they are needed. These include:  Canes.  Walkers.  Scooters.  Crutches.  Turn on the lights when you go into a dark area. Replace any light bulbs as soon as they burn out.  Set up your furniture so you have a clear path. Avoid moving your furniture around.  If any of your floors are uneven, fix them.  If there are any pets around you, be aware of where they are.  Review your medicines with your doctor. Some medicines can make you feel dizzy. This can increase your chance of falling. Ask your doctor what other things that you can do to help prevent falls. This information is not intended to replace advice given to you by your health care provider. Make sure you discuss any questions you have with your health care provider. Document Released: 07/21/2009 Document Revised: 03/01/2016 Document Reviewed: 10/29/2014 Elsevier Interactive Patient Education  2017 [employer].

## 2020-08-30 ENCOUNTER — Telehealth: Payer: Self-pay

## 2020-08-30 NOTE — Telephone Encounter (Signed)
Copied from CRM [id number]. Topic: General - Inquiry >> Aug 30, 2020  8:35 AM Crist Infante wrote: Reason for CRM: pt would like a call back from Delice Bison about a "concern" she has.  That is all she would tell me.

## 2020-08-30 NOTE — Telephone Encounter (Signed)
Pt c/o Zetia causing lips burning and sweating

## 2020-10-04 DIAGNOSIS — Z23 Encounter for immunization: Secondary | ICD-10-CM | POA: Diagnosis not present

## 2020-11-01 DIAGNOSIS — H353132 Nonexudative age-related macular degeneration, bilateral, intermediate dry stage: Secondary | ICD-10-CM | POA: Diagnosis not present

## 2020-11-21 ENCOUNTER — Telehealth: Payer: Self-pay

## 2020-11-21 NOTE — Telephone Encounter (Signed)
Copied from CRM [id number]. Topic: Quick Communication - See Telephone Encounter >> Nov 21, 2020 11:22 AM Aretta Nip wrote: CRM for notification. See Telephone encounter for: 11/21/20.v Pt is stating has a high cholesterol record and medicare did not pu the 1 time yearly ck as usual and wanting to make sure it is coded correctly before calling medicare. FU at [phone].

## 2020-11-21 NOTE — Telephone Encounter (Signed)
I was thinking she was talking about the medicare yearly exam?

## 2020-11-21 NOTE — Telephone Encounter (Signed)
So it sounds like this is just the labs from 03-21-2023. We coded it with hyperlipidemia and she says she doesn't have a deductible. So, I told her she needed to call [employer] and see what they say.

## 2020-11-21 NOTE — Telephone Encounter (Signed)
We should probably find out what she is specifically referring to as far as not being covered and what the date of service was. My visits are only coded with level of service for [phone] or [id number] which is initial and subsequent medicare annual wellness visit. I don't put any disease specific coding so that's what makes me thing it's a lab possibly.

## 2020-11-21 NOTE — Telephone Encounter (Signed)
I am not sure what is going on with this, but I see she saw you last. It appears she is seeing [facility] now as well? Can you help me figure this out

## 2020-11-21 NOTE — Telephone Encounter (Signed)
It looks like she only received a covid vaccine from [facility] but why she would get it there i'm not sure. I see they listed a [facility] provider as her PCP starting 10/04/20 but there is nothing stating she has established care there as a patient. Is she questioning the lab not being paid for or medication? Labs were done last summer. We may need some additional details but I'm happy to help if needed.

## 2020-11-22 ENCOUNTER — Telehealth: Payer: Self-pay

## 2020-11-22 NOTE — Telephone Encounter (Signed)
Pt called and stated she called [facility] and they stated they could not accept the information from her they need this information directly from the Doctors office/ or letterhead / please advise

## 2020-11-22 NOTE — Telephone Encounter (Signed)
I called [facility] and added E78.5 to the DOS- they are going to re-submit the claim to her insurance. Spoke to [employer]

## 2020-11-22 NOTE — Telephone Encounter (Signed)
Copied from CRM [id number]. Topic: General - Other >> Nov 21, 2020  4:49 PM Herby Abraham C wrote: Reason for CRM: pt is returning Tara's call. Pt says that she is assisting her with a concern.    CB: [phone]

## 2020-11-22 NOTE — Telephone Encounter (Signed)
I do not know what else to tell this patient about her June visit. Please call her and assist

## 2020-12-22 ENCOUNTER — Other Ambulatory Visit: Payer: Self-pay

## 2020-12-22 ENCOUNTER — Encounter: Payer: Self-pay | Admitting: Family Medicine

## 2020-12-22 ENCOUNTER — Ambulatory Visit (INDEPENDENT_AMBULATORY_CARE_PROVIDER_SITE_OTHER): Payer: Medicare Other | Admitting: Family Medicine

## 2020-12-22 VITALS — BP 130/78 | HR 68 | Ht 63.5 in | Wt 134.0 lb

## 2020-12-22 DIAGNOSIS — E782 Mixed hyperlipidemia: Secondary | ICD-10-CM | POA: Diagnosis not present

## 2020-12-22 DIAGNOSIS — Z8709 Personal history of other diseases of the respiratory system: Secondary | ICD-10-CM

## 2020-12-22 DIAGNOSIS — J984 Other disorders of lung: Secondary | ICD-10-CM | POA: Diagnosis not present

## 2020-12-22 DIAGNOSIS — Z90711 Acquired absence of uterus with remaining cervical stump: Secondary | ICD-10-CM | POA: Diagnosis not present

## 2020-12-22 MED ORDER — ALBUTEROL SULFATE HFA 108 (90 BASE) MCG/ACT IN AERS: INHALATION_SPRAY | RESPIRATORY_TRACT | 11 refills | Status: DC

## 2020-12-22 NOTE — Progress Notes (Signed)
Date:  12/22/2020   Name:  Amy Shah   DOB:  [birth year]   MRN:  [id number]   Chief Complaint: Allergic Rhinitis  (Has to use inhaler once or twice a month )  URI  This is a chronic problem. The current episode started more than 1 year ago. The problem has been waxing and waning. Associated symptoms include congestion and rhinorrhea. Pertinent negatives include no abdominal pain, chest pain, coughing, diarrhea, dysuria, ear pain, headaches, joint pain, joint swelling, nausea, neck pain, plugged ear sensation, rash, sinus pain, sneezing, sore throat, swollen glands, vomiting or wheezing. She has tried acetaminophen and inhaler use for the symptoms. The treatment provided moderate relief.  Asthma There is no chest tightness, cough, difficulty breathing, frequent throat clearing, hemoptysis, hoarse voice, shortness of breath, sputum production or wheezing. Primary symptoms comments: For bronchiectasis. This is a chronic problem. The current episode started more than 1 year ago. The problem occurs rarely. The problem has been waxing and waning. Associated symptoms include ear congestion and rhinorrhea. Pertinent negatives include no appetite change, chest pain, dyspnea on exertion, ear pain, fever, headaches, heartburn, malaise/fatigue, myalgias, nasal congestion, orthopnea, PND, postnasal drip, sneezing, sore throat, sweats, trouble swallowing or weight loss. Her symptoms are alleviated by beta-agonist. She reports moderate improvement on treatment. Her past medical history is significant for bronchiectasis. There is no history of asthma, bronchitis, COPD, emphysema or pneumonia.    Lab Results  Component Value Date   CREATININE 0.78 04/13/2020   BUN 20 04/13/2020   NA 140 04/13/2020   K 4.7 04/13/2020   CL 103 04/13/2020   CO2 23 04/13/2020   Lab Results  Component Value Date   CHOL 213 (H) 03/15/2020   HDL 69 03/15/2020   LDLCALC 133 (H) 03/15/2020   TRIG 64 03/15/2020   Lab Results   Component Value Date   TSH 2.400 04/13/2020   No results found for: HGBA1C Lab Results  Component Value Date   WBC 4.5 04/13/2020   HGB 13.2 04/13/2020   HCT 39.2 04/13/2020   MCV 98 (H) 04/13/2020   PLT 185 04/13/2020   Lab Results  Component Value Date   ALT 18 04/13/2020   AST 26 04/13/2020   ALKPHOS 95 04/13/2020   BILITOT 0.5 04/13/2020     Review of Systems  Constitutional: Negative for appetite change, fever, malaise/fatigue and weight loss.  HENT: Positive for congestion and rhinorrhea. Negative for ear pain, hoarse voice, postnasal drip, sinus pain, sneezing, sore throat and trouble swallowing.   Respiratory: Negative for cough, hemoptysis, sputum production, shortness of breath and wheezing.   Cardiovascular: Negative for chest pain, dyspnea on exertion and PND.  Gastrointestinal: Negative for abdominal pain, diarrhea, heartburn, nausea and vomiting.  Genitourinary: Negative for dysuria.  Musculoskeletal: Negative for joint pain, myalgias and neck pain.  Skin: Negative for rash.  Neurological: Negative for headaches.    Patient Active Problem List   Diagnosis Date Noted  . History of partial hysterectomy 12/22/2020  . Osteoarthritis of hip 08/28/2018  . Degeneration of lumbar intervertebral disc 10/31/2017  . History of total hip arthroplasty 10/31/2017  . Sacroiliac joint pain 10/31/2017  . Scoliosis of lumbar spine 10/31/2017  . Risk for falls 03/22/2017    Allergies  Allergen Reactions  . Codeine Nausea Only and Other (See Comments)    Dizzy and nausea  . Methocarbamol Other (See Comments)    Other reaction(s): Dizziness  . Tetanus Toxoids Swelling and Other (See Comments)  Swelling and fever  . Zetia [Ezetimibe]     Lips swelling, burning  . Sulfa Antibiotics Rash    Past Surgical History:  Procedure Laterality Date  . ABDOMINAL HYSTERECTOMY     partial/ left cervix  . CATARACT EXTRACTION    . COLON SURGERY     colectomy- removed polyp  in cecrum- benign  . JOINT REPLACEMENT Left    hip  . TONSILLECTOMY      Social History   Tobacco Use  . Smoking status: Never Smoker  . Smokeless tobacco: Never Used  Substance Use Topics  . Alcohol use: Yes    Alcohol/week: 1.0 standard drink    Types: 1 Glasses of wine per week    Comment: once a year  . Drug use: No     Medication list has been reviewed and updated.  Current Meds  Medication Sig  . acetaminophen (TYLENOL) 500 MG tablet Take 1,000 mg by mouth 2 (two) times daily.  Marland Kitchen albuterol (VENTOLIN HFA) 108 (90 Base) MCG/ACT inhaler INHALE 1 PUFF INTO THE LUNGS 2 TIMES A WEEK  . Cholecalciferol (VITAMIN D-3) 25 MCG (1000 UT) CAPS Take 1 capsule by mouth daily.  . Ibuprofen 200 MG CAPS Take by mouth.  . Multiple Vitamins-Minerals (PRESERVISION AREDS PO) Take 2 tablets by mouth daily.    PHQ 2/9 Scores 12/22/2020 07/25/2020 04/13/2020 03/15/2020  PHQ - 2 Score 0 0 0 0  PHQ- 9 Score 0 - 0 0    GAD 7 : Generalized Anxiety Score 12/22/2020 04/13/2020 03/15/2020 12/02/2019  Nervous, Anxious, on Edge 0 0 0 0  Control/stop worrying 0 0 0 0  Worry too much - different things 0 0 0 0  Trouble relaxing 0 0 0 0  Restless 0 0 0 0  Easily annoyed or irritable 0 0 0 0  Afraid - awful might happen 0 0 0 0  Total GAD 7 Score 0 0 0 0    BP Readings from Last 3 Encounters:  12/22/20 130/78  04/13/20 120/60  03/15/20 138/80    Physical Exam  Wt Readings from Last 3 Encounters:  12/22/20 134 lb (60.8 kg)  04/13/20 140 lb (63.5 kg)  03/15/20 143 lb (64.9 kg)    BP 130/78   Pulse 68   Ht 5' 3.5" (1.613 m)   Wt 134 lb (60.8 kg)   BMI 23.36 kg/m   Assessment and Plan: 1. Mixed hyperlipidemia Chronic.  Uncontrolled.  Relatively stable.  What ever has been tried whether statin or Zetia patient has been unable to tolerate.  I have suggested that she obtain omega-3 and perhaps she can tolerate that and we will recheck in 6 to 8 months.  2. Small airways disease .  Controlled.   Stable.  Continue albuterol inhaler 1 to 2 puffs every 6 hours as needed - albuterol (VENTOLIN HFA) 108 (90 Base) MCG/ACT inhaler; INHALE 1 PUFF INTO THE LUNGS 2 TIMES A WEEK  Dispense: 8.5 g; Refill: 11  3. History of bronchiectasis She has a history of bronchiectasis which she occasionally uses Ventolin inhaler 1 to 2 puffs every 6 hours when this should be exacerbated.   4.  History of partial hysterectomy patient does state that she has a cervix but has never had an abnormal Pap and has not had any suggestion of abnormality since.

## 2021-02-23 IMAGING — US US PELVIS COMPLETE
1 series · 14 of 16 positions shown · non-contrast
Comparison: None available.

CLINICAL DATA: Initial evaluation for unintentional weight loss,
left-sided abdominal pain. History of prior hysterectomy.

EXAM:
TRANSABDOMINAL ULTRASOUND OF PELVIS
TECHNIQUE: Transabdominal ultrasound examination of the pelvis was performed
including evaluation of the uterus, ovaries, adnexal regions, and
pelvic cul-de-sac.

[Series 1: us pelvis complete · 0.18mm/px · 14 of 16 slices shown]
[im 1/16]
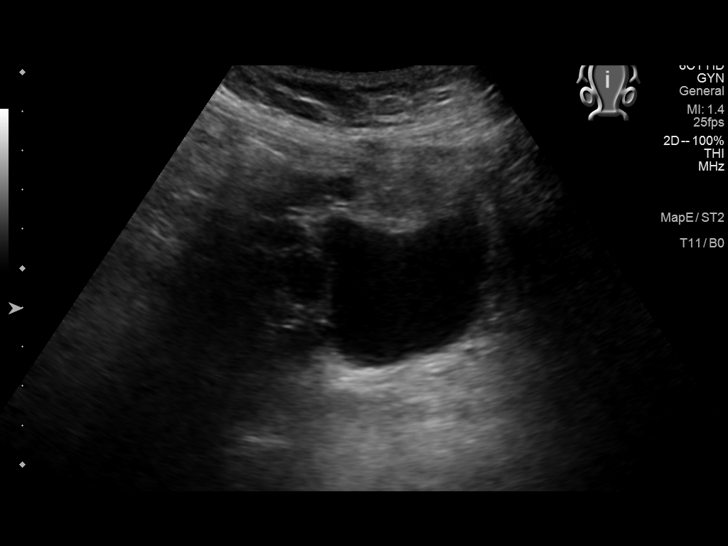
[im 2/16]
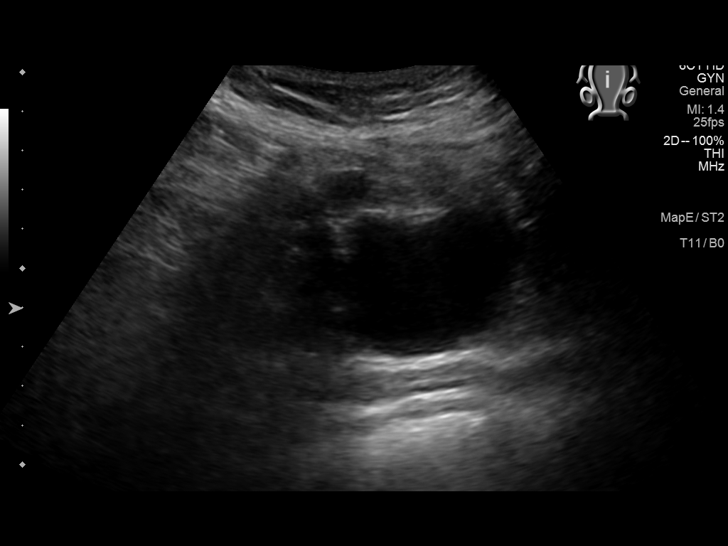
[im 3/16]
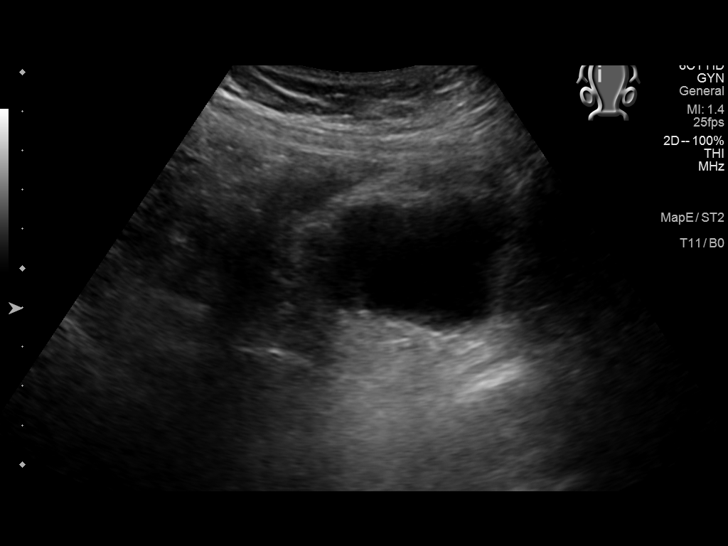
[im 5/16]
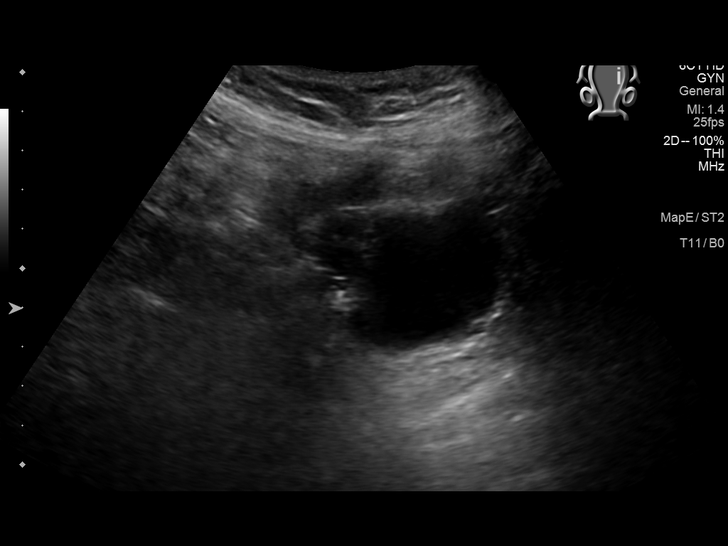
[im 6/16]
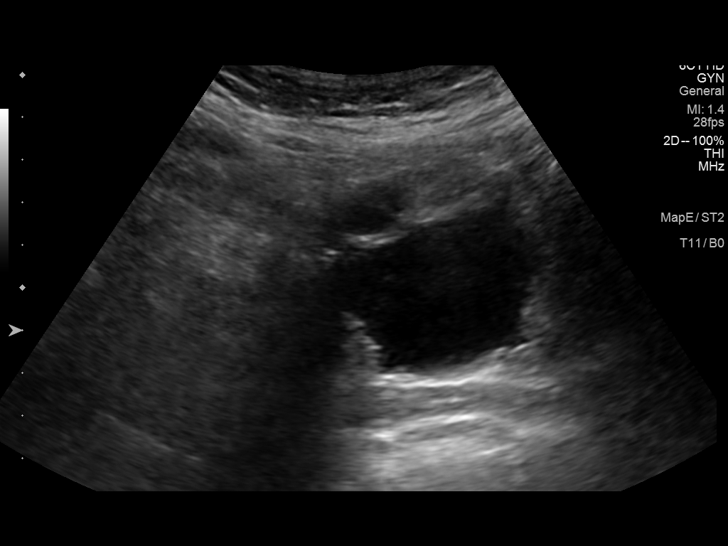
[im 7/16]
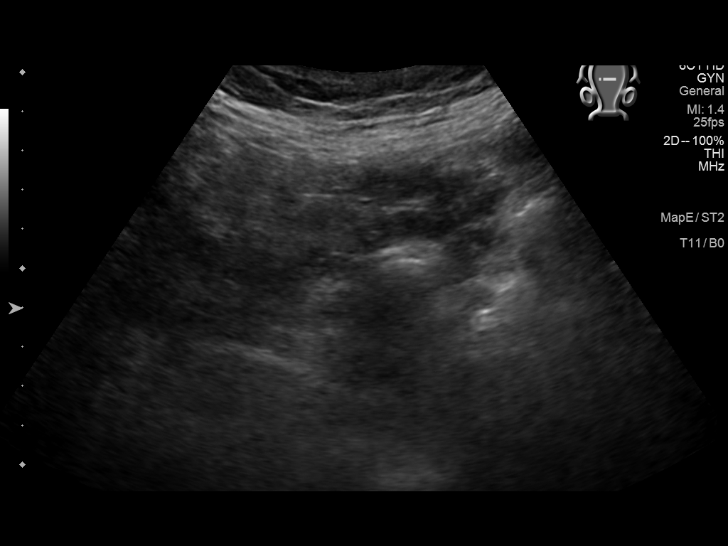
[im 8/16]
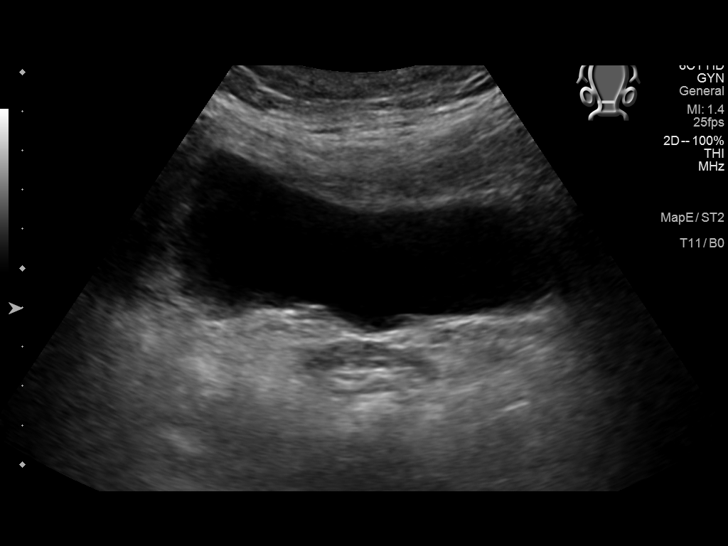
[im 9/16]
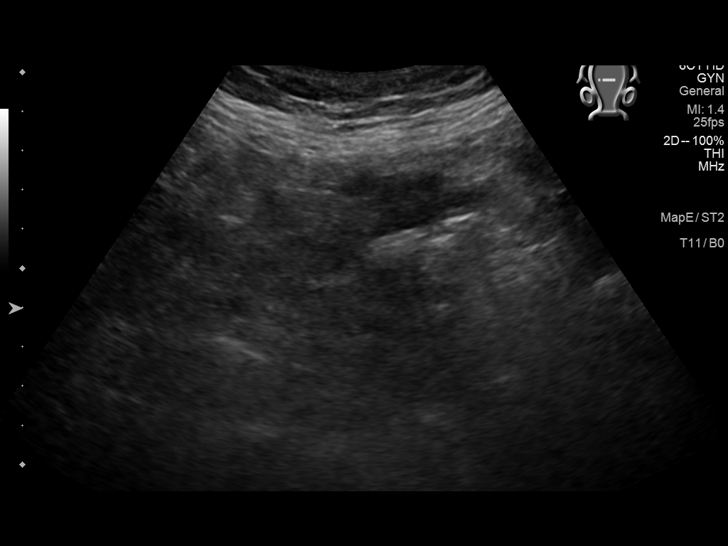
[im 10/16]
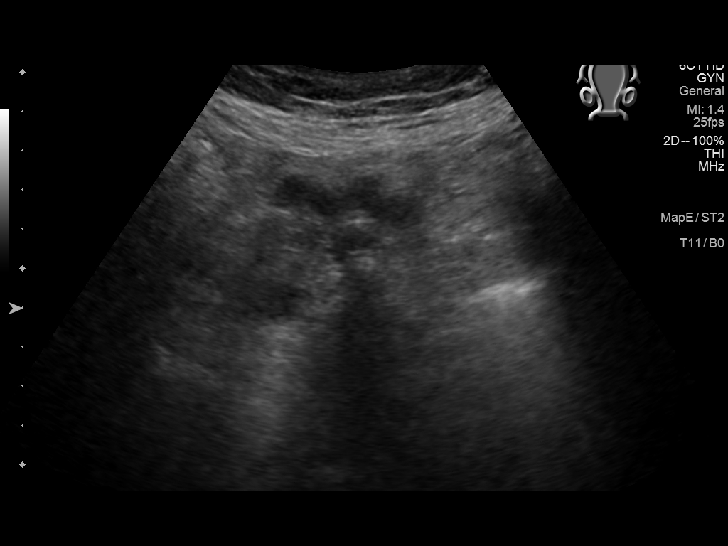
[im 11/16]
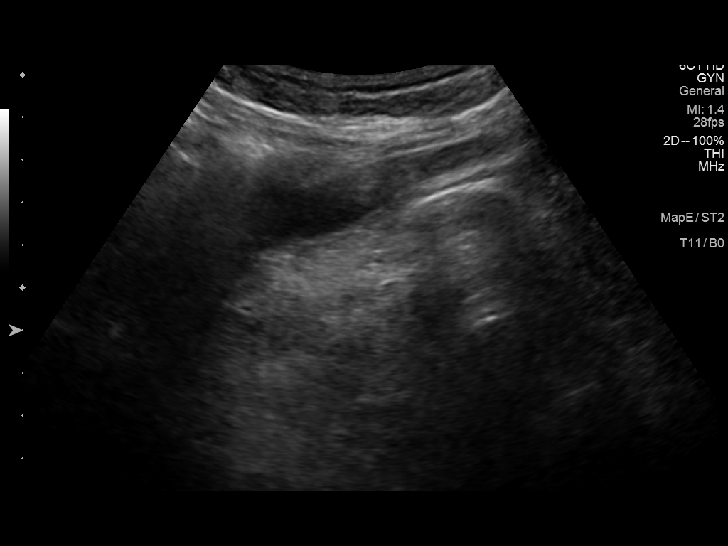
[im 13/16]
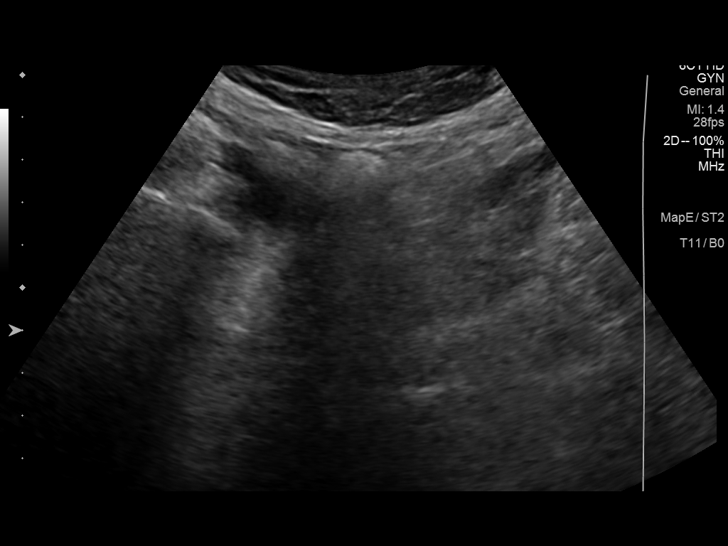
[im 14/16]
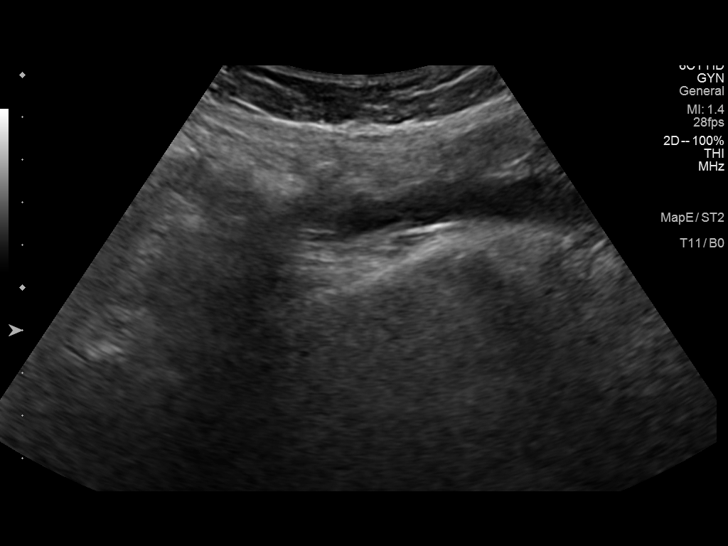
[im 15/16]
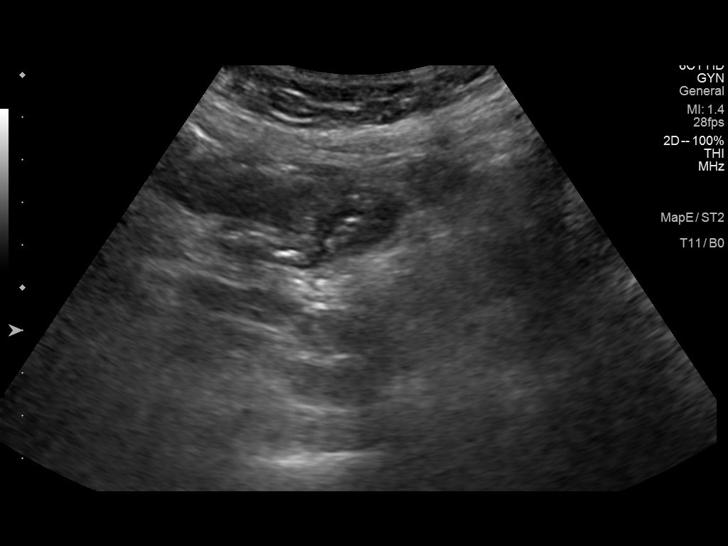
[im 16/16]
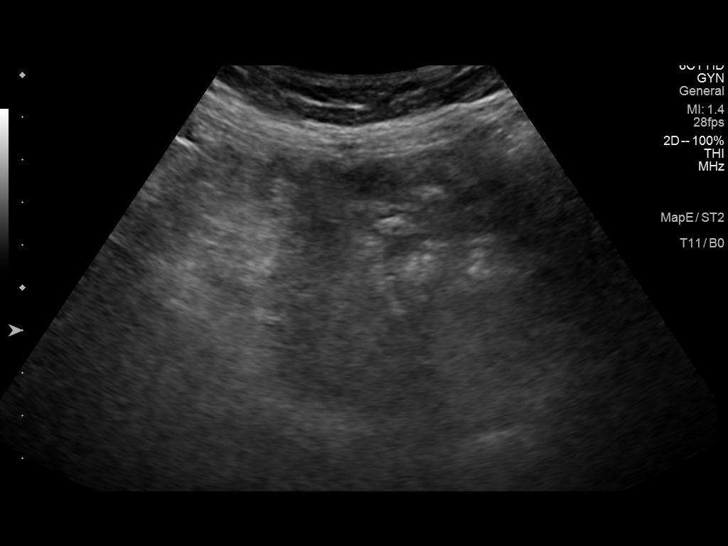

[14 of 16 positions shown; findings below may reference images not displayed]

FINDINGS: Uterus

Surgically absent.  No abnormality about the vaginal cuff.

Endometrium

Surgically absent.

Right ovary

Not visualized.  No adnexal mass.

Left ovary

Not visualized.  No adnexal mass.

Other findings:  No abnormal free fluid.
IMPRESSION: Prior hysterectomy with nonvisualization of the ovaries. No pelvic
or adnexal mass. No abnormal free fluid or other acute abnormality.

## 2021-05-04 DIAGNOSIS — H353132 Nonexudative age-related macular degeneration, bilateral, intermediate dry stage: Secondary | ICD-10-CM | POA: Diagnosis not present

## 2021-05-09 DIAGNOSIS — H353132 Nonexudative age-related macular degeneration, bilateral, intermediate dry stage: Secondary | ICD-10-CM | POA: Diagnosis not present

## 2021-05-30 ENCOUNTER — Ambulatory Visit: Payer: Self-pay | Admitting: *Deleted

## 2021-05-30 ENCOUNTER — Ambulatory Visit (INDEPENDENT_AMBULATORY_CARE_PROVIDER_SITE_OTHER): Payer: Medicare Other | Admitting: Family Medicine

## 2021-05-30 ENCOUNTER — Ambulatory Visit: Payer: Medicare Other | Admitting: Family Medicine

## 2021-05-30 ENCOUNTER — Other Ambulatory Visit: Payer: Self-pay

## 2021-05-30 ENCOUNTER — Encounter: Payer: Self-pay | Admitting: Family Medicine

## 2021-05-30 VITALS — BP 140/94 | HR 64 | Ht 63.5 in | Wt 135.0 lb

## 2021-05-30 DIAGNOSIS — H8309 Labyrinthitis, unspecified ear: Secondary | ICD-10-CM | POA: Diagnosis not present

## 2021-05-30 MED ORDER — MECLIZINE HCL 12.5 MG PO TABS: 12.5000 mg | ORAL_TABLET | Freq: Two times a day (BID) | ORAL | 0 refills | Status: DC | PRN

## 2021-05-30 NOTE — Telephone Encounter (Signed)
Pt reports H/O vertigo "But this is different, like my head is in a bucket, not really dizzy." States occurs going from sitting to standing, onset Saturday. Denies any congestion, no headache, no earache or sinus pressure. Also reports nausea at times. Had pt check orthostatic BPs... Sitting 154/80  HR 74   Standing for 3 minutes 171/101  and 164/96. Agent secured appt for pt prior to triage, 1120 today with Dr. Yetta Barre. Care advise given, pt verbalizes understanding.

## 2021-05-30 NOTE — Patient Instructions (Signed)
Orthostatic Hypotension Blood pressure is a measurement of how strongly, or weakly, your blood is pressing against the walls of your arteries. Orthostatic hypotension is a sudden drop in blood pressure that happens when you quickly change positions,such as when you get up from sitting or lying down. Arteries are blood vessels that carry blood from your heart throughout your body. When blood pressure is too low, you may not get enough blood to your brain or to the rest of your organs. This can cause weakness, light-headedness, rapid heartbeat, and fainting. This can last for just a few seconds or for up to a few minutes. Orthostatic hypotension is usually not a serious problem. However, if it happens frequently or gets worse, it may be a sign of somethingmore serious. What are the causes? This condition may be caused by: Sudden changes in posture, such as standing up quickly after you have been sitting or lying down. Blood loss. Loss of body fluids (dehydration). Heart problems. Hormone (endocrine) problems. Pregnancy. Severe infection. Lack of certain nutrients. Severe allergic reactions (anaphylaxis). Certain medicines, such as blood pressure medicine or medicines that make the body lose excess fluids (diuretics). Sometimes, this condition can be caused by not taking medicine as directed, such as taking too much of a certain medicine. What increases the risk? The following factors may make you more likely to develop this condition: Age. Risk increases as you get older. Conditions that affect the heart or the central nervous system. Taking certain medicines, such as blood pressure medicine or diuretics. Being pregnant. What are the signs or symptoms? Symptoms of this condition may include: Weakness. Light-headedness. Dizziness. Blurred vision. Fatigue. Rapid heartbeat. Fainting, in severe cases. How is this diagnosed? This condition is diagnosed based on: Your medical history. Your  symptoms. Your blood pressure measurement. Your health care provider will check your blood pressure when you are: Lying down. Sitting. Standing. A blood pressure reading is recorded as two numbers, such as "120 over 80" (or 120/80). The first ("top") number is called the systolic pressure. It is a measure of the pressure in your arteries as your heart beats. The second ("bottom") number is called the diastolic pressure. It is a measure of the pressure in your arteries when your heart relaxes between beats. Blood pressure is measured in a unit called mm Hg. Healthy blood pressure for most adults is 120/80. If your blood pressure is below 90/60, you may be diagnosed withhypotension. Other information or tests that may be used to diagnose orthostatic hypotension include: Your other vital signs, such as your heart rate and temperature. Blood tests. Tilt table test. For this test, you will be safely secured to a table that moves you from a lying position to an upright position. Your heart rhythm and blood pressure will be monitored during the test. How is this treated? This condition may be treated by: Changing your diet. This may involve eating more salt (sodium) or drinking more water. Taking medicines to raise your blood pressure. Changing the dosage of certain medicines you are taking that might be lowering your blood pressure. Wearing compression stockings. These stockings help to prevent blood clots and reduce swelling in your legs. In some cases, you may need to go to the hospital for: Fluid replacement. This means you will receive fluids through an IV. Blood replacement. This means you will receive donated blood through an IV (transfusion). Treating an infection or heart problems, if this applies. Monitoring. You may need to be monitored while medicines that you   are taking wear off. Follow these instructions at home: Eating and drinking  Drink enough fluid to keep your urine pale  yellow. Eat a healthy diet, and follow instructions from your health care provider about eating or drinking restrictions. A healthy diet includes: Fresh fruits and vegetables. Whole grains. Lean meats. Low-fat dairy products. Eat extra salt only as directed. Do not add extra salt to your diet unless your health care provider told you to do that. Eat frequent, small meals. Avoid standing up suddenly after eating.  Medicines Take over-the-counter and prescription medicines only as told by your health care provider. Follow instructions from your health care provider about changing the dosage of your current medicines, if this applies. Do not stop or adjust any of your medicines on your own. General instructions  Wear compression stockings as told by your health care provider. Get up slowly from lying down or sitting positions. This gives your blood pressure a chance to adjust. Avoid hot showers and excessive heat as directed by your health care provider. Return to your normal activities as told by your health care provider. Ask your health care provider what activities are safe for you. Do not use any products that contain nicotine or tobacco, such as cigarettes, e-cigarettes, and chewing tobacco. If you need help quitting, ask your health care provider. Keep all follow-up visits as told by your health care provider. This is important.  Contact a health care provider if you: Vomit. Have diarrhea. Have a fever for more than 2-3 days. Feel more thirsty than usual. Feel weak and tired. Get help right away if you: Have chest pain. Have a fast or irregular heartbeat. Develop numbness in any part of your body. Cannot move your arms or your legs. Have trouble speaking. Become sweaty or feel light-headed. Faint. Feel short of breath. Have trouble staying awake. Feel confused. Summary Orthostatic hypotension is a sudden drop in blood pressure that happens when you quickly change  positions. Orthostatic hypotension is usually not a serious problem. It is diagnosed by having your blood pressure taken lying down, sitting, and then standing. It may be treated by changing your diet or adjusting your medicines. This information is not intended to replace advice given to you by your health care provider. Make sure you discuss any questions you have with your healthcare provider. Document Revised: 03/20/2018 Document Reviewed: 03/20/2018 Elsevier Patient Education  2022 [employer]  

## 2021-05-30 NOTE — Progress Notes (Signed)
Date:  05/30/2021   Name:  Amy Shah   DOB:  07/15/1935   MRN:  [id number]   Chief Complaint: Hypertension (Having dizziness when walking- took benadryl no relief. B/P went up to 101 on bottom when standing up- nurse took it)  Hypertension This is a new problem. The current episode started in the past 7 days. The problem is unchanged. The problem is controlled. Pertinent negatives include no anxiety, blurred vision, chest pain, headaches, malaise/fatigue, neck pain, orthopnea, palpitations, peripheral edema, shortness of breath or sweats. Risk factors for coronary artery disease include sedentary lifestyle. Past treatments include ACE inhibitors. The current treatment provides moderate improvement. There are no compliance problems.  There is no history of angina, kidney disease, CAD/MI, CVA, heart failure, left ventricular hypertrophy, PVD or retinopathy. There is no history of chronic renal disease, a hypertension causing med or renovascular disease.  Dizziness This is a new problem. The current episode started in the past 7 days. The problem occurs intermittently. The problem has been waxing and waning. Associated symptoms include nausea. Pertinent negatives include no chest pain, chills, diaphoresis, fatigue, fever, headaches, neck pain, numbness, vertigo or weakness.   Lab Results  Component Value Date   CREATININE 0.78 04/13/2020   BUN 20 04/13/2020   NA 140 04/13/2020   K 4.7 04/13/2020   CL 103 04/13/2020   CO2 23 04/13/2020   Lab Results  Component Value Date   CHOL 213 (H) 03/15/2020   HDL 69 03/15/2020   LDLCALC 133 (H) 03/15/2020   TRIG 64 03/15/2020   Lab Results  Component Value Date   TSH 2.400 04/13/2020   No results found for: HGBA1C Lab Results  Component Value Date   WBC 4.5 04/13/2020   HGB 13.2 04/13/2020   HCT 39.2 04/13/2020   MCV 98 (H) 04/13/2020   PLT 185 04/13/2020   Lab Results  Component Value Date   ALT 18 04/13/2020   AST 26 04/13/2020    ALKPHOS 95 04/13/2020   BILITOT 0.5 04/13/2020     Review of Systems  Constitutional:  Negative for chills, diaphoresis, fatigue, fever and malaise/fatigue.  Eyes:  Negative for blurred vision.  Respiratory:  Negative for shortness of breath.   Cardiovascular:  Negative for chest pain, palpitations and orthopnea.  Gastrointestinal:  Positive for nausea.  Musculoskeletal:  Negative for neck pain.  Neurological:  Positive for dizziness and light-headedness. Negative for vertigo, weakness, numbness and headaches.   Patient Active Problem List   Diagnosis Date Noted   History of partial hysterectomy 12/22/2020   Osteoarthritis of hip 08/28/2018   Degeneration of lumbar intervertebral disc 10/31/2017   History of total hip arthroplasty 10/31/2017   Sacroiliac joint pain 10/31/2017   Scoliosis of lumbar spine 10/31/2017   Risk for falls 03/22/2017    Allergies  Allergen Reactions   Codeine Nausea Only and Other (See Comments)    Dizzy and nausea   Methocarbamol Other (See Comments)    Other reaction(s): Dizziness   Tetanus Toxoids Swelling and Other (See Comments)    Swelling and fever   Zetia [Ezetimibe]     Lips swelling, burning   Sulfa Antibiotics Rash    Past Surgical History:  Procedure Laterality Date   ABDOMINAL HYSTERECTOMY     partial/ left cervix   CATARACT EXTRACTION     COLON SURGERY     colectomy- removed polyp in cecrum- benign   JOINT REPLACEMENT Left    hip   TONSILLECTOMY  Social History   Tobacco Use   Smoking status: Never   Smokeless tobacco: Never  Substance Use Topics   Alcohol use: Yes    Alcohol/week: 1.0 standard drink    Types: 1 Glasses of wine per week    Comment: once a year   Drug use: No     Medication list has been reviewed and updated.  Current Meds  Medication Sig   acetaminophen (TYLENOL) 500 MG tablet Take 1,000 mg by mouth 2 (two) times daily.   Cholecalciferol (VITAMIN D-3) 25 MCG (1000 UT) CAPS Take 1  capsule by mouth daily.   Ibuprofen 200 MG CAPS Take by mouth.   meclizine (ANTIVERT) 12.5 MG tablet Take 1 tablet (12.5 mg total) by mouth 2 (two) times daily as needed for dizziness.   Multiple Vitamins-Minerals (PRESERVISION AREDS PO) Take 2 tablets by mouth daily.    PHQ 2/9 Scores 05/30/2021 12/22/2020 07/25/2020 04/13/2020  PHQ - 2 Score 0 0 0 0  PHQ- 9 Score 0 0 - 0    GAD 7 : Generalized Anxiety Score 05/30/2021 12/22/2020 04/13/2020 03/15/2020  Nervous, Anxious, on Edge 0 0 0 0  Control/stop worrying 0 0 0 0  Worry too much - different things 0 0 0 0  Trouble relaxing 0 0 0 0  Restless 0 0 0 0  Easily annoyed or irritable 0 0 0 0  Afraid - awful might happen 0 0 0 0  Total GAD 7 Score 0 0 0 0    BP Readings from Last 3 Encounters:  05/30/21 (!) 140/94  12/22/20 130/78  04/13/20 120/60    Physical Exam Vitals and nursing note reviewed.  HENT:     Right Ear: Tympanic membrane, ear canal and external ear normal. There is impacted cerumen.     Left Ear: Tympanic membrane, ear canal and external ear normal.     Nose: No congestion or rhinorrhea.  Eyes:     Pupils: Pupils are equal, round, and reactive to light.  Cardiovascular:     Rate and Rhythm: Normal rate.     Pulses: Normal pulses.     Heart sounds: Normal heart sounds. No murmur heard.   No friction rub. No gallop.  Pulmonary:     Effort: Pulmonary effort is normal. No respiratory distress.     Breath sounds: Normal breath sounds. No stridor. No wheezing, rhonchi or rales.  Chest:     Chest wall: No tenderness.  Abdominal:     General: Abdomen is flat.  Musculoskeletal:     Cervical back: Normal range of motion and neck supple.  Neurological:     Mental Status: She is alert.     Cranial Nerves: No cranial nerve deficit.    Wt Readings from Last 3 Encounters:  05/30/21 135 lb (61.2 kg)  12/22/20 134 lb (60.8 kg)  04/13/20 140 lb (63.5 kg)    BP (!) 140/94 (BP Location: Right Arm, Cuff Size: Normal)    Pulse 64   Ht 5' 3.5" (1.613 m)   Wt 135 lb (61.2 kg)   BMI 23.54 kg/m   Assessment and Plan:  1. Labyrinthitis, unspecified laterality New onset.  Recurrence.  Episodic.  It is hard to tell how much of the dizziness was due to blood pressure whether hypotensive versus transient elevation versus the possibility of exacerbation of her labyrinthine's.  At this point in time there may be element of dehydration that may be contributing to this as well I do not think  at this time it would be in the best interest that we restart a blood pressure medicine other than to be careful with excess sodium intake.  We will be rechecking patient anyway next week as to her blood pressure in the meantime low-dose meclizine has been called to be used on a as needed basis if there is perception of dizziness particularly when she is standing such as brushing her teeth or or change in position.

## 2021-05-30 NOTE — Telephone Encounter (Signed)
Reason for Disposition  [1] MODERATE dizziness (e.g., interferes with normal activities) AND [2] has been evaluated by physician for this  Answer Assessment - Initial Assessment Questions 1. DESCRIPTION: "Describe your dizziness."     "Not really dizzy, like my head is in a bucket" 2. LIGHTHEADED: "Do you feel lightheaded?" (e.g., somewhat faint, woozy, weak upon standing)     No 3. VERTIGO: "Do you feel like either you or the room is spinning or tilting?" (i.e. vertigo)     No 4. SEVERITY: "How bad is it?"  "Do you feel like you are going to faint?" "Can you stand and walk?"   - MILD: Feels slightly dizzy, but walking normally.   - MODERATE: Feels unsteady when walking, but not falling; interferes with normal activities (e.g., school, work).   - SEVERE: Unable to walk without falling, or requires assistance to walk without falling; feels like passing out now.     Mid-moderate 5. ONSET:  "When did the dizziness begin?"     Saturday 6. AGGRAVATING FACTORS: "Does anything make it worse?" (e.g., standing, change in head position)     Sitting to standing 7. HEART RATE: "Can you tell me your heart rate?" "How many beats in 15 seconds?"  (Note: not all patients can do this)       74-89 8. CAUSE: "What do you think is causing the dizziness?"     "Maybe inner ear" 9. RECURRENT SYMPTOM: "Have you had dizziness before?" If Yes, ask: "When was the last time?" "What happened that time?"     Yes, vertigo 10. OTHER SYMPTOMS: "Do you have any other symptoms?" (e.g., fever, chest pain, vomiting, diarrhea, bleeding)       no  Protocols used: Dizziness - Lightheadedness-A-AH

## 2021-06-08 ENCOUNTER — Ambulatory Visit (INDEPENDENT_AMBULATORY_CARE_PROVIDER_SITE_OTHER): Payer: Medicare Other | Admitting: Family Medicine

## 2021-06-08 ENCOUNTER — Encounter: Payer: Self-pay | Admitting: Family Medicine

## 2021-06-08 ENCOUNTER — Other Ambulatory Visit: Payer: Self-pay

## 2021-06-08 VITALS — BP 120/80 | HR 68 | Ht 63.5 in | Wt 137.0 lb

## 2021-06-08 DIAGNOSIS — E782 Mixed hyperlipidemia: Secondary | ICD-10-CM

## 2021-06-08 DIAGNOSIS — R030 Elevated blood-pressure reading, without diagnosis of hypertension: Secondary | ICD-10-CM | POA: Diagnosis not present

## 2021-06-08 DIAGNOSIS — H8309 Labyrinthitis, unspecified ear: Secondary | ICD-10-CM

## 2021-06-08 NOTE — Progress Notes (Signed)
Date:  06/08/2021   Name:  Amy Shah   DOB:  02-Nov-1934   MRN:  [id number]   Chief Complaint: Dizziness (Has improved with meclizine at night.)  Dizziness This is a new problem. The current episode started 1 to 4 weeks ago. The problem occurs intermittently. The problem has been gradually improving. Pertinent negatives include no fatigue, myalgias, vertigo or weakness. Nothing aggravates the symptoms. Treatments tried: meclizine. The treatment provided significant relief.  Hyperlipidemia This is a chronic problem. The current episode started more than 1 year ago. The problem is controlled. Recent lipid tests were reviewed and are normal. She has no history of chronic renal disease, diabetes or liver disease. Pertinent negatives include no myalgias. Current antihyperlipidemic treatment includes diet change.   Lab Results  Component Value Date   CREATININE 0.78 04/13/2020   BUN 20 04/13/2020   NA 140 04/13/2020   K 4.7 04/13/2020   CL 103 04/13/2020   CO2 23 04/13/2020   Lab Results  Component Value Date   CHOL 213 (H) 03/15/2020   HDL 69 03/15/2020   LDLCALC 133 (H) 03/15/2020   TRIG 64 03/15/2020   Lab Results  Component Value Date   TSH 2.400 04/13/2020   No results found for: HGBA1C Lab Results  Component Value Date   WBC 4.5 04/13/2020   HGB 13.2 04/13/2020   HCT 39.2 04/13/2020   MCV 98 (H) 04/13/2020   PLT 185 04/13/2020   Lab Results  Component Value Date   ALT 18 04/13/2020   AST 26 04/13/2020   ALKPHOS 95 04/13/2020   BILITOT 0.5 04/13/2020     Review of Systems  Constitutional:  Negative for fatigue.  Musculoskeletal:  Negative for myalgias.  Neurological:  Positive for dizziness. Negative for vertigo and weakness.   Patient Active Problem List   Diagnosis Date Noted   History of partial hysterectomy 12/22/2020   Osteoarthritis of hip 08/28/2018   Degeneration of lumbar intervertebral disc 10/31/2017   History of total hip arthroplasty  10/31/2017   Sacroiliac joint pain 10/31/2017   Scoliosis of lumbar spine 10/31/2017   Risk for falls 03/22/2017    Allergies  Allergen Reactions   Codeine Nausea Only and Other (See Comments)    Dizzy and nausea   Methocarbamol Other (See Comments)    Other reaction(s): Dizziness   Tetanus Toxoids Swelling and Other (See Comments)    Swelling and fever   Zetia [Ezetimibe]     Lips swelling, burning   Sulfa Antibiotics Rash    Past Surgical History:  Procedure Laterality Date   ABDOMINAL HYSTERECTOMY     partial/ left cervix   CATARACT EXTRACTION     COLON SURGERY     colectomy- removed polyp in cecrum- benign   JOINT REPLACEMENT Left    hip   TONSILLECTOMY      Social History   Tobacco Use   Smoking status: Never   Smokeless tobacco: Never  Substance Use Topics   Alcohol use: Yes    Alcohol/week: 1.0 standard drink    Types: 1 Glasses of wine per week    Comment: once a year   Drug use: No     Medication list has been reviewed and updated.  Current Meds  Medication Sig   acetaminophen (TYLENOL) 500 MG tablet Take 1,000 mg by mouth 2 (two) times daily.   Cholecalciferol (VITAMIN D-3) 25 MCG (1000 UT) CAPS Take 1 capsule by mouth daily.   Ibuprofen 200 MG  CAPS Take by mouth.   meclizine (ANTIVERT) 12.5 MG tablet Take 1 tablet (12.5 mg total) by mouth 2 (two) times daily as needed for dizziness.   Multiple Vitamins-Minerals (PRESERVISION AREDS PO) Take 2 tablets by mouth daily.    PHQ 2/9 Scores 06/08/2021 05/30/2021 12/22/2020 07/25/2020  PHQ - 2 Score 0 0 0 0  PHQ- 9 Score 0 0 0 -    GAD 7 : Generalized Anxiety Score 06/08/2021 05/30/2021 12/22/2020 04/13/2020  Nervous, Anxious, on Edge 0 0 0 0  Control/stop worrying 0 0 0 0  Worry too much - different things 0 0 0 0  Trouble relaxing 0 0 0 0  Restless 0 0 0 0  Easily annoyed or irritable 0 0 0 0  Afraid - awful might happen 0 0 0 0  Total GAD 7 Score 0 0 0 0    BP Readings from Last 3 Encounters:   05/30/21 (!) 140/94  12/22/20 130/78  04/13/20 120/60    Physical Exam  Wt Readings from Last 3 Encounters:  06/08/21 137 lb (62.1 kg)  05/30/21 135 lb (61.2 kg)  12/22/20 134 lb (60.8 kg)    Ht 5' 3.5" (1.613 m)   Wt 137 lb (62.1 kg)   BMI 23.89 kg/m   Assessment and Plan:  1. Labyrinthitis, unspecified laterality New onset.  Resolved.  Stable.  Patient has done well on meclizine and we will continue this on a as needed basis.  2. Elevated blood pressure, situational Pressure reading was mildly elevated on previous occasion but is excellent today at 120/80.  We will check CMP for electrolytes and GFR. - Comprehensive Metabolic Panel (CMET)  3. Mixed hyperlipidemia Chronic.  Controlled.  Stable.  Patient has had elevated cholesterol in the past and she controls with diet.  We will check lipid panel to evaluate L DL and triglycerides. - Lipid Panel With LDL/HDL Ratio

## 2021-06-09 LAB — COMPREHENSIVE METABOLIC PANEL
ALT: 14 IU/L (ref 0–32)
AST: 22 IU/L (ref 0–40)
Albumin/Globulin Ratio: 2.5 — ABNORMAL HIGH (ref 1.2–2.2)
Albumin: 4.8 g/dL — ABNORMAL HIGH (ref 3.6–4.6)
Alkaline Phosphatase: 96 IU/L (ref 44–121)
BUN/Creatinine Ratio: 26 (ref 12–28)
BUN: 21 mg/dL (ref 8–27)
Bilirubin Total: 0.4 mg/dL (ref 0.0–1.2)
CO2: 23 mmol/L (ref 20–29)
Calcium: 9.2 mg/dL (ref 8.7–10.3)
Chloride: 100 mmol/L (ref 96–106)
Creatinine, Ser: 0.8 mg/dL (ref 0.57–1.00)
Globulin, Total: 1.9 g/dL (ref 1.5–4.5)
Glucose: 80 mg/dL (ref 65–99)
Potassium: 4.6 mmol/L (ref 3.5–5.2)
Sodium: 140 mmol/L (ref 134–144)
Total Protein: 6.7 g/dL (ref 6.0–8.5)
eGFR: 73 mL/min/{1.73_m2} (ref >59)

## 2021-06-09 LAB — LIPID PANEL WITH LDL/HDL RATIO
Cholesterol, Total: 201 mg/dL — ABNORMAL HIGH (ref 100–199)
HDL: 67 mg/dL (ref >39)
LDL Chol Calc (NIH): 123 mg/dL — ABNORMAL HIGH (ref 0–99)
LDL/HDL Ratio: 1.8 ratio (ref 0.0–3.2)
Triglycerides: 58 mg/dL (ref 0–149)
VLDL Cholesterol Cal: 11 mg/dL (ref 5–40)

## 2021-07-13 DIAGNOSIS — Z23 Encounter for immunization: Secondary | ICD-10-CM | POA: Diagnosis not present

## 2021-07-14 ENCOUNTER — Telehealth: Payer: Self-pay

## 2021-07-14 NOTE — Telephone Encounter (Signed)
Copied from CRM [id number]. Topic: General - Other >> Jul 14, 2021  8:34 AM Payton Spark N wrote: Reason for CRM: Pt called in stating Delice Bison wanted her to let her know when she got her high dose flu shot and pt stated she got it yesterday 10/06.

## 2021-07-14 NOTE — Telephone Encounter (Signed)
Noted  Chart updated.  KP

## 2021-07-26 ENCOUNTER — Ambulatory Visit (INDEPENDENT_AMBULATORY_CARE_PROVIDER_SITE_OTHER): Payer: Medicare Other

## 2021-07-26 DIAGNOSIS — Z0000 Encounter for general adult medical examination without abnormal findings: Secondary | ICD-10-CM | POA: Diagnosis not present

## 2021-07-26 NOTE — Progress Notes (Signed)
Subjective:   Amy Shah is a 86 y.o. female who presents for Medicare Annual (Subsequent) preventive examination.  Virtual Visit via Telephone Note  I connected with  Amy Shah on 07/26/21 at  2:00 PM EDT by telephone and verified that I am speaking with the correct person using two identifiers.  Location: Patient: home Provider: [facility] Persons participating in the virtual visit: patient/Nurse Health Advisor   I discussed the limitations, risks, security and privacy concerns of performing an evaluation and management service by telephone and the availability of in person appointments. The patient expressed understanding and agreed to proceed.  Interactive audio and video telecommunications were attempted between this nurse and patient, however failed, due to patient having technical difficulties OR patient did not have access to video capability.  We continued and completed visit with audio only.  Some vital signs may be absent or patient reported.   Amy Littler, LPN   Review of Systems     Cardiac Risk Factors include: advanced age (>38men, >68 women);dyslipidemia     Objective:    There were no vitals filed for this visit. There is no height or weight on file to calculate BMI.  Advanced Directives 07/26/2021 07/25/2020 03/26/2015  Does Patient Have a Medical Advance Directive? Yes Yes Yes  Type of Estate agent of [place];Living will Healthcare Power of [place];Living will Healthcare Power of [place];Living will  Copy of Healthcare Power of Attorney in Chart? No - copy requested No - copy requested Yes    Current Medications (verified) Outpatient Encounter Medications as of 07/26/2021  Medication Sig   acetaminophen (TYLENOL) 500 MG tablet Take 1,000 mg by mouth 2 (two) times daily.   Cholecalciferol (VITAMIN D-3) 25 MCG (1000 UT) CAPS Take 1 capsule by mouth daily.   Ibuprofen 200 MG CAPS Take by mouth.   Multiple Vitamins-Minerals  (PRESERVISION AREDS PO) Take 2 tablets by mouth daily.   [DISCONTINUED] albuterol (VENTOLIN HFA) 108 (90 Base) MCG/ACT inhaler INHALE 1 PUFF INTO THE LUNGS 2 TIMES A WEEK (Patient not taking: Reported on 06/08/2021)   [DISCONTINUED] meclizine (ANTIVERT) 12.5 MG tablet Take 1 tablet (12.5 mg total) by mouth 2 (two) times daily as needed for dizziness.   No facility-administered encounter medications on file as of 07/26/2021.    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   Lung abnormality    Right middle lobe syndrome   Macular degeneration    Shingles    Vertigo    Past Surgical History:  Procedure Laterality Date   ABDOMINAL HYSTERECTOMY     partial/ left cervix   CATARACT EXTRACTION     COLON SURGERY     colectomy- removed polyp in cecrum- benign   JOINT REPLACEMENT Left    hip   TONSILLECTOMY     Family History  Problem Relation Age of Onset   Cancer Mother    Stroke Father    Social History   Socioeconomic History   Marital status: Married    Spouse name: Not on file   Number of children: 2   Years of education: Not on file   Highest education level: Not on file  Occupational History   Not on file  Tobacco Use   Smoking status: Never   Smokeless tobacco: Never  Vaping Use   Vaping Use: Never used  Substance and Sexual Activity   Alcohol use: Not Currently   Drug use: No   Sexual activity: Not Currently  Other Topics Concern   Not on file  Social History Narrative   Not on file   Social Determinants of Health   Financial Resource Strain: Low Risk    Difficulty of Paying Living Expenses: Not hard at all  Food Insecurity: No Food Insecurity   Worried About Programme researcher, broadcasting/film/video in the Last Year: Never true   Ran Out of Food in the Last Year: Never true   Transportation Needs: No Transportation Needs   Lack of Transportation (Medical): No   Lack of Transportation (Non-Medical): No  Physical Activity: Inactive   Days of Exercise per Week: 0 days   Minutes of Exercise per Session: 0 min  Stress: No Stress Concern Present   Feeling of Stress : Only a little  Social Connections: Moderately Isolated   Frequency of Communication with Friends and Family: More than three times a week   Frequency of Social Gatherings with Friends and Family: More than three times a week   Attends Religious Services: Never   Database administrator or Organizations: No   Attends Engineer, structural: Never   Marital Status: Married    Tobacco Counseling Counseling given: Not Answered   Clinical Intake:  Pre-visit preparation completed: Yes  Pain : No/denies pain     Nutritional Risks: None Diabetes: No  How often do you need to have someone help you when you read instructions, pamphlets, or other written materials from your doctor or pharmacy?: 1 - Never    Interpreter Needed?: No  Information entered by :: Amy Littler LPN   Activities of Daily Living In your present state of health, do you have any difficulty performing the following activities: 07/26/2021  Hearing? N  Vision? N  Difficulty concentrating or making decisions? N  Walking or climbing stairs? N  Dressing or bathing? N  Doing errands, shopping? Y  Comment does not Engineer, manufacturing and eating ? N  Using the Toilet? N  In the past six months, have you accidently leaked urine? Y  Do you have problems with loss of bowel control? N  Managing your Medications? N  Managing your Finances? N  Housekeeping or managing your Housekeeping? N  Some recent data might be hidden    Patient Care Team: Duanne Limerick, MD as PCP - General (Family Medicine)  Indicate any recent Medical Services you may have received from other than [facility] providers in the past year (date may  be approximate).     Assessment:   This is a routine wellness examination for [name].  Hearing/Vision screen Hearing Screening - Comments:: Pt denies hearing difficulty Vision Screening - Comments:: Annual vision screenings with Dr. Brooke Dare [facility]  Dietary issues and exercise activities discussed: Current Exercise Habits: The patient does not participate in regular exercise at present, Exercise limited by: orthopedic condition(s)   Goals Addressed             This Visit's Progress    DIET - INCREASE WATER INTAKE   On track    Recommend drinking 6-8 glasses of water per day      Increase physical activity       Recommend increasing physical activity       Depression Screen PHQ 2/9 Scores 07/26/2021 06/08/2021 05/30/2021 12/22/2020 07/25/2020 04/13/2020 03/15/2020  PHQ -  2 Score 0 0 0 0 0 0 0  PHQ- 9 Score 1 0 0 0 - 0 0    Fall Risk Fall Risk  07/26/2021 06/08/2021 07/25/2020 04/13/2020 03/15/2020  Falls in the past year? 0 0 0 0 0  Number falls in past yr: 0 0 0 - -  Injury with Fall? 0 0 0 - -  Risk for fall due to : No Fall Risks No Fall Risks Impaired balance/gait;Impaired vision - Impaired balance/gait;Impaired mobility  Follow up Falls prevention discussed Falls evaluation completed Falls prevention discussed Falls evaluation completed Falls evaluation completed    FALL RISK PREVENTION PERTAINING TO THE HOME:  Any stairs in or around the home? Yes  If so, are there any without handrails? No  Home free of loose throw rugs in walkways, pet beds, electrical cords, etc? Yes  Adequate lighting in your home to reduce risk of falls? Yes   ASSISTIVE DEVICES UTILIZED TO PREVENT FALLS:  Life alert? No  Use of a cane, walker or w/c? No  Grab bars in the bathroom? Yes  Shower chair or bench in shower? No  Elevated toilet seat or a handicapped toilet? No   TIMED UP AND GO:  Was the test performed? No . Telephonic visit  Cognitive Function: Normal cognitive status  assessed by direct observation by this Nurse Health Advisor. No abnormalities found.       6CIT Screen 07/25/2020  What Year? 0 points  What month? 0 points  What time? 0 points  Count back from 20 0 points  Months in reverse 0 points  Repeat phrase 0 points  Total Score 0    Immunizations Immunization History  Administered Date(s) Administered   Fluad Quad(high Dose 65+) 07/13/2021   Influenza, High Dose Seasonal PF 07/06/2015, 07/02/2016, 06/17/2019, 06/25/2020   Influenza,inj,Quad PF,6+ Mos 07/04/2018   Influenza-Unspecified 09/15/2008, 07/01/2017   PFIZER Comirnaty(Gray Top)Covid-19 Tri-Sucrose Vaccine 10/04/2020   PFIZER(Purple Top)SARS-COV-2 Vaccination 01/01/2020, 01/22/2020   Pneumococcal Conjugate-13 02/15/2015   Pneumococcal Polysaccharide-23 10/08/2010    Tdap status: n/a due to hx of reaction to tetanus vaccine  Flu Vaccine status: Up to date  Pneumococcal vaccine status: Completed during today's visit.  Covid-19 vaccine status: Completed vaccines  Qualifies for Shingles Vaccine? Yes   Zostavax completed Yes  per patient Shingrix Completed?: No.    Education has been provided regarding the importance of this vaccine. Patient has been advised to call insurance company to determine out of pocket expense if they have not yet received this vaccine. Advised may also receive vaccine at local pharmacy or Health Dept. Verbalized acceptance and understanding.  Screening Tests Health Maintenance  Topic Date Due   COVID-19 Vaccine (4 - Booster for Pfizer series) 12/27/2020   Zoster Vaccines- Shingrix (1 of 2) 09/07/2021 (Originally 01/10/1954)   DEXA SCAN  12/22/2021 (Originally 01/09/2002)   TETANUS/TDAP  12/22/2021 (Originally 01/10/1954)   Pneumonia Vaccine 23+ Years old  Completed   INFLUENZA VACCINE  Completed   HPV VACCINES  Aged Out    Health Maintenance  Health Maintenance Due  Topic Date Due   COVID-19 Vaccine (4 - Booster for Pfizer series) 12/27/2020     Colorectal cancer screening: No longer required.   Mammogram status: No longer required due to age.  Bone density status: no longer required due to age  Lung Cancer Screening: (Low Dose CT Chest recommended if Age 40-80 years, 30 pack-year currently smoking OR have quit w/in 15years.) does not qualify.   Additional Screening:  Hepatitis C Screening: does not qualify  Vision Screening: Recommended annual ophthalmology exams for early detection of glaucoma and other disorders of the eye. Is the patient up to date with their annual eye exam?  Yes  Who is the provider or what is the name of the office in which the patient attends annual eye exams? Dr. Brooke Dare.   Dental Screening: Recommended annual dental exams for proper oral hygiene  Community Resource Referral / Chronic Care Management: CRR required this visit?  No   CCM required this visit?  No      Plan:     I have personally reviewed and noted the following in the patient's chart:   Medical and social history Use of alcohol, tobacco or illicit drugs  Current medications and supplements including opioid prescriptions.  Functional ability and status Nutritional status Physical activity Advanced directives List of other physicians Hospitalizations, surgeries, and ER visits in previous 12 months Vitals Screenings to include cognitive, depression, and falls Referrals and appointments  In addition, I have reviewed and discussed with patient certain preventive protocols, quality metrics, and best practice recommendations. A written personalized care plan for preventive services as well as general preventive health recommendations were provided to patient.     Amy Littler, LPN   [id number]   Nurse Notes: none

## 2021-07-26 NOTE — Patient Instructions (Signed)
[name] , Thank you for taking time to come for your Medicare Wellness Visit. I appreciate your ongoing commitment to your health goals. Please review the following plan we discussed and let me know if I can assist you in the future.   Screening recommendations/referrals: Colonoscopy: no longer required Mammogram: no longer required Bone Density: no longer required Recommended yearly ophthalmology/optometry visit for glaucoma screening and checkup Recommended yearly dental visit for hygiene and checkup  Vaccinations: Influenza vaccine: done 07/13/21 Pneumococcal vaccine: done 02/15/15 Tdap vaccine: n/a Shingles vaccine: Shingrix discussed. Please contact your pharmacy for coverage information.  Covid-19: done 01/01/20, 01/22/20 & 10/04/20  Advanced directives: Please bring a copy of your health care power of attorney and living will to the office at your convenience.   Conditions/risks identified: Recommend continuing fall prevention at home  Next appointment: Follow up in one year for your annual wellness visit    Preventive Care 65 Years and Older, Female Preventive care refers to lifestyle choices and visits with your health care provider that can promote health and wellness. What does preventive care include? A yearly physical exam. This is also called an annual well check. Dental exams once or twice a year. Routine eye exams. Ask your health care provider how often you should have your eyes checked. Personal lifestyle choices, including: Daily care of your teeth and gums. Regular physical activity. Eating a healthy diet. Avoiding tobacco and drug use. Limiting alcohol use. Practicing safe sex. Taking low-dose aspirin every day. Taking vitamin and mineral supplements as recommended by your health care provider. What happens during an annual well check? The services and screenings done by your health care provider during your annual well check will depend on your age, overall  health, lifestyle risk factors, and family history of disease. Counseling  Your health care provider may ask you questions about your: Alcohol use. Tobacco use. Drug use. Emotional well-being. Home and relationship well-being. Sexual activity. Eating habits. History of falls. Memory and ability to understand (cognition). Work and work Astronomer. Reproductive health. Screening  You may have the following tests or measurements: Height, weight, and BMI. Blood pressure. Lipid and cholesterol levels. These may be checked every 5 years, or more frequently if you are over 67 years old. Skin check. Lung cancer screening. You may have this screening every year starting at age 24 if you have a 30-pack-year history of smoking and currently smoke or have quit within the past 15 years. Fecal occult blood test (FOBT) of the stool. You may have this test every year starting at age 49. Flexible sigmoidoscopy or colonoscopy. You may have a sigmoidoscopy every 5 years or a colonoscopy every 10 years starting at age 57. Hepatitis C blood test. Hepatitis B blood test. Sexually transmitted disease (STD) testing. Diabetes screening. This is done by checking your blood sugar (glucose) after you have not eaten for a while (fasting). You may have this done every 1-3 years. Bone density scan. This is done to screen for osteoporosis. You may have this done starting at age 75. Mammogram. This may be done every 1-2 years. Talk to your health care provider about how often you should have regular mammograms. Talk with your health care provider about your test results, treatment options, and if necessary, the need for more tests. Vaccines  Your health care provider may recommend certain vaccines, such as: Influenza vaccine. This is recommended every year. Tetanus, diphtheria, and acellular pertussis (Tdap, Td) vaccine. You may need a Td booster every 10  years. Zoster vaccine. You may need this after age  15. Pneumococcal 13-valent conjugate (PCV13) vaccine. One dose is recommended after age 63. Pneumococcal polysaccharide (PPSV23) vaccine. One dose is recommended after age 49. Talk to your health care provider about which screenings and vaccines you need and how often you need them. This information is not intended to replace advice given to you by your health care provider. Make sure you discuss any questions you have with your health care provider. Document Released: 10/21/2015 Document Revised: 06/13/2016 Document Reviewed: 07/26/2015 Elsevier Interactive Patient Education  2017 [place] Prevention in the Home Falls can cause injuries. They can happen to people of all ages. There are many things you can do to make your home safe and to help prevent falls. What can I do on the outside of my home? Regularly fix the edges of walkways and driveways and fix any cracks. Remove anything that might make you trip as you walk through a door, such as a raised step or threshold. Trim any bushes or trees on the path to your home. Use bright outdoor lighting. Clear any walking paths of anything that might make someone trip, such as rocks or tools. Regularly check to see if handrails are loose or broken. Make sure that both sides of any steps have handrails. Any raised decks and porches should have guardrails on the edges. Have any leaves, snow, or ice cleared regularly. Use sand or salt on walking paths during winter. Clean up any spills in your garage right away. This includes oil or grease spills. What can I do in the bathroom? Use night lights. Install grab bars by the toilet and in the tub and shower. Do not use towel bars as grab bars. Use non-skid mats or decals in the tub or shower. If you need to sit down in the shower, use a plastic, non-slip stool. Keep the floor dry. Clean up any water that spills on the floor as soon as it happens. Remove soap buildup in the tub or shower  regularly. Attach bath mats securely with double-sided non-slip rug tape. Do not have throw rugs and other things on the floor that can make you trip. What can I do in the bedroom? Use night lights. Make sure that you have a light by your bed that is easy to reach. Do not use any sheets or blankets that are too big for your bed. They should not hang down onto the floor. Have a firm chair that has side arms. You can use this for support while you get dressed. Do not have throw rugs and other things on the floor that can make you trip. What can I do in the kitchen? Clean up any spills right away. Avoid walking on wet floors. Keep items that you use a lot in easy-to-reach places. If you need to reach something above you, use a strong step stool that has a grab bar. Keep electrical cords out of the way. Do not use floor polish or wax that makes floors slippery. If you must use wax, use non-skid floor wax. Do not have throw rugs and other things on the floor that can make you trip. What can I do with my stairs? Do not leave any items on the stairs. Make sure that there are handrails on both sides of the stairs and use them. Fix handrails that are broken or loose. Make sure that handrails are as long as the stairways. Check any carpeting to make sure  that it is firmly attached to the stairs. Fix any carpet that is loose or worn. Avoid having throw rugs at the top or bottom of the stairs. If you do have throw rugs, attach them to the floor with carpet tape. Make sure that you have a light switch at the top of the stairs and the bottom of the stairs. If you do not have them, ask someone to add them for you. What else can I do to help prevent falls? Wear shoes that: Do not have high heels. Have rubber bottoms. Are comfortable and fit you well. Are closed at the toe. Do not wear sandals. If you use a stepladder: Make sure that it is fully opened. Do not climb a closed stepladder. Make sure that  both sides of the stepladder are locked into place. Ask someone to hold it for you, if possible. Clearly mark and make sure that you can see: Any grab bars or handrails. First and last steps. Where the edge of each step is. Use tools that help you move around (mobility aids) if they are needed. These include: Canes. Walkers. Scooters. Crutches. Turn on the lights when you go into a dark area. Replace any light bulbs as soon as they burn out. Set up your furniture so you have a clear path. Avoid moving your furniture around. If any of your floors are uneven, fix them. If there are any pets around you, be aware of where they are. Review your medicines with your doctor. Some medicines can make you feel dizzy. This can increase your chance of falling. Ask your doctor what other things that you can do to help prevent falls. This information is not intended to replace advice given to you by your health care provider. Make sure you discuss any questions you have with your health care provider. Document Released: 07/21/2009 Document Revised: 03/01/2016 Document Reviewed: 10/29/2014 Elsevier Interactive Patient Education  2017 [employer].

## 2021-08-27 DIAGNOSIS — Z23 Encounter for immunization: Secondary | ICD-10-CM | POA: Diagnosis not present

## 2021-10-03 DIAGNOSIS — H353221 Exudative age-related macular degeneration, left eye, with active choroidal neovascularization: Secondary | ICD-10-CM | POA: Diagnosis not present

## 2021-10-04 DIAGNOSIS — H353221 Exudative age-related macular degeneration, left eye, with active choroidal neovascularization: Secondary | ICD-10-CM | POA: Diagnosis not present

## 2021-11-01 DIAGNOSIS — H353221 Exudative age-related macular degeneration, left eye, with active choroidal neovascularization: Secondary | ICD-10-CM | POA: Diagnosis not present

## 2021-12-06 ENCOUNTER — Ambulatory Visit: Payer: Medicare Other | Admitting: Family Medicine

## 2021-12-06 DIAGNOSIS — H353221 Exudative age-related macular degeneration, left eye, with active choroidal neovascularization: Secondary | ICD-10-CM | POA: Diagnosis not present

## 2022-01-15 ENCOUNTER — Encounter: Payer: Self-pay | Admitting: Family Medicine

## 2022-01-15 ENCOUNTER — Ambulatory Visit (INDEPENDENT_AMBULATORY_CARE_PROVIDER_SITE_OTHER): Payer: Medicare Other | Admitting: Family Medicine

## 2022-01-15 VITALS — BP 130/80 | HR 80 | Ht 63.5 in | Wt 137.0 lb

## 2022-01-15 DIAGNOSIS — R030 Elevated blood-pressure reading, without diagnosis of hypertension: Secondary | ICD-10-CM | POA: Diagnosis not present

## 2022-01-15 NOTE — Progress Notes (Signed)
? ? ?Date:  01/15/2022  ? ?Name:  Amy Shah   DOB:  [birth year]   MRN:  [id number] ? ? ?Chief Complaint: Follow-up (Just a recheck- doesn't take any meds) ? ?Hypertension ?This is a chronic problem. The current episode started more than 1 year ago. The problem has been gradually improving since onset. The problem is controlled. Pertinent negatives include no blurred vision, chest pain, headaches, neck pain, orthopnea, palpitations, peripheral edema or shortness of breath. Risk factors for coronary artery disease include dyslipidemia and post-menopausal state. Past treatments include lifestyle changes. The current treatment provides moderate improvement. There are no compliance problems.  There is no history of angina, kidney disease, CAD/MI, CVA, heart failure, left ventricular hypertrophy, PVD or retinopathy. There is no history of chronic renal disease, a hypertension causing med or renovascular disease.  ? ?Lab Results  ?Component Value Date  ? NA 140 06/08/2021  ? K 4.6 06/08/2021  ? CO2 23 06/08/2021  ? GLUCOSE 80 06/08/2021  ? BUN 21 06/08/2021  ? CREATININE 0.80 06/08/2021  ? CALCIUM 9.2 06/08/2021  ? EGFR 73 06/08/2021  ? GFRNONAA 71 04/13/2020  ? ?Lab Results  ?Component Value Date  ? CHOL 201 (H) 06/08/2021  ? HDL 67 06/08/2021  ? LDLCALC 123 (H) 06/08/2021  ? TRIG 58 06/08/2021  ? ?Lab Results  ?Component Value Date  ? TSH 2.400 04/13/2020  ? ?No results found for: HGBA1C ?Lab Results  ?Component Value Date  ? WBC 4.5 04/13/2020  ? HGB 13.2 04/13/2020  ? HCT 39.2 04/13/2020  ? MCV 98 (H) 04/13/2020  ? PLT 185 04/13/2020  ? ?Lab Results  ?Component Value Date  ? ALT 14 06/08/2021  ? AST 22 06/08/2021  ? ALKPHOS 96 06/08/2021  ? BILITOT 0.4 06/08/2021  ? ?No results found for: 25OHVITD2, [place], VD25OH  ? ?Review of Systems  ?Constitutional:  Negative for chills and fever.  ?HENT:  Negative for drooling, ear discharge, ear pain and sore throat.   ?Eyes:  Negative for blurred vision.  ?Respiratory:   Negative for cough, shortness of breath and wheezing.   ?Cardiovascular:  Negative for chest pain, palpitations, orthopnea and leg swelling.  ?Gastrointestinal:  Negative for abdominal pain, blood in stool, constipation, diarrhea and nausea.  ?Endocrine: Negative for polydipsia.  ?Genitourinary:  Negative for dysuria, frequency, hematuria and urgency.  ?Musculoskeletal:  Negative for back pain, myalgias and neck pain.  ?Skin:  Negative for rash.  ?Allergic/Immunologic: Negative for environmental allergies.  ?Neurological:  Negative for dizziness and headaches.  ?Hematological:  Does not bruise/bleed easily.  ?Psychiatric/Behavioral:  Negative for suicidal ideas. The patient is not nervous/anxious.   ? ?Patient Active Problem List  ? Diagnosis Date Noted  ? History of partial hysterectomy 12/22/2020  ? Osteoarthritis of hip 08/28/2018  ? Degeneration of lumbar intervertebral disc 10/31/2017  ? History of total hip arthroplasty 10/31/2017  ? Sacroiliac joint pain 10/31/2017  ? Scoliosis of lumbar spine 10/31/2017  ? Risk for falls 03/22/2017  ? ? ?Allergies  ?Allergen Reactions  ? Codeine Nausea Only and Other (See Comments)  ?  Dizzy and nausea  ? Methocarbamol Other (See Comments)  ?  Other reaction(s): Dizziness  ? Tetanus Toxoids Swelling and Other (See Comments)  ?  Swelling and fever  ? Zetia [Ezetimibe]   ?  Lips swelling, burning  ? Sulfa Antibiotics Rash  ? ? ?Past Surgical History:  ?Procedure Laterality Date  ? ABDOMINAL HYSTERECTOMY    ? partial/ left cervix  ?  CATARACT EXTRACTION    ? COLON SURGERY    ? colectomy- removed polyp in cecrum- benign  ? JOINT REPLACEMENT Left   ? hip  ? TONSILLECTOMY    ? ? ?Social History  ? ?Tobacco Use  ? Smoking status: Never  ? Smokeless tobacco: Never  ?Vaping Use  ? Vaping Use: Never used  ?Substance Use Topics  ? Alcohol use: Not Currently  ? Drug use: No  ? ? ? ?Medication list has been reviewed and updated. ? ?Current Meds  ?Medication Sig  ? Cholecalciferol (VITAMIN  D-3) 25 MCG (1000 UT) CAPS Take 1 capsule by mouth daily.  ? Ibuprofen 200 MG CAPS Take by mouth.  ? Multiple Vitamins-Minerals (PRESERVISION AREDS PO) Take 2 tablets by mouth daily.  ? ? ? ?  01/15/2022  ? 10:52 AM 06/08/2021  ? 10:18 AM 05/30/2021  ?  2:19 PM 12/22/2020  ? 10:18 AM  ?GAD 7 : Generalized Anxiety Score  ?Nervous, Anxious, on Edge 0 0 0 0  ?Control/stop worrying 0 0 0 0  ?Worry too much - different things 0 0 0 0  ?Trouble relaxing 0 0 0 0  ?Restless 0 0 0 0  ?Easily annoyed or irritable 0 0 0 0  ?Afraid - awful might happen 0 0 0 0  ?Total GAD 7 Score 0 0 0 0  ?Anxiety Difficulty Not difficult at all     ? ? ? ?  01/15/2022  ? 10:52 AM  ?Depression screen PHQ 2/9  ?Decreased Interest 0  ?Down, Depressed, Hopeless 0  ?PHQ - 2 Score 0  ?Altered sleeping 0  ?Tired, decreased energy 0  ?Change in appetite 0  ?Feeling bad or failure about yourself  0  ?Trouble concentrating 0  ?Moving slowly or fidgety/restless 0  ?Suicidal thoughts 0  ?PHQ-9 Score 0  ?Difficult doing work/chores Not difficult at all  ? ? ?BP Readings from Last 3 Encounters:  ?01/15/22 130/80  ?06/08/21 120/80  ?05/30/21 (!) 140/94  ? ? ?Physical Exam ?Vitals and nursing note reviewed.  ?Constitutional:   ?   Appearance: She is well-developed.  ?HENT:  ?   Head: Normocephalic.  ?   Right Ear: Tympanic membrane, ear canal and external ear normal.  ?   Left Ear: Tympanic membrane, ear canal and external ear normal.  ?   Nose: Nose normal.  ?Eyes:  ?   General: Lids are everted, no foreign bodies appreciated. No scleral icterus.    ?   Left eye: No foreign body or hordeolum.  ?   Conjunctiva/sclera: Conjunctivae normal.  ?   Right eye: Right conjunctiva is not injected.  ?   Left eye: Left conjunctiva is not injected.  ?   Pupils: Pupils are equal, round, and reactive to light.  ?Neck:  ?   Thyroid: No thyromegaly.  ?   Vascular: No JVD.  ?   Trachea: No tracheal deviation.  ?Cardiovascular:  ?   Rate and Rhythm: Normal rate and regular rhythm.   ?   Pulses: Normal pulses.  ?   Heart sounds: Normal heart sounds, S1 normal and S2 normal. No murmur heard. ?No systolic murmur is present.  ?No diastolic murmur is present.  ?  No friction rub. No gallop. No S3 or S4 sounds.  ?Pulmonary:  ?   Effort: Pulmonary effort is normal. No respiratory distress.  ?   Breath sounds: Normal breath sounds. No wheezing, rhonchi or rales.  ?Chest:  ?   Chest  wall: No tenderness.  ?Abdominal:  ?   General: Bowel sounds are normal.  ?   Palpations: Abdomen is soft. There is no mass.  ?   Tenderness: There is no abdominal tenderness. There is no guarding or rebound.  ?Musculoskeletal:     ?   General: No tenderness. Normal range of motion.  ?   Cervical back: Normal range of motion and neck supple.  ?Lymphadenopathy:  ?   Cervical: No cervical adenopathy.  ?Skin: ?   General: Skin is warm.  ?   Findings: No rash.  ?Neurological:  ?   Mental Status: She is alert and oriented to person, place, and time.  ?   Cranial Nerves: No cranial nerve deficit.  ?   Deep Tendon Reflexes: Reflexes normal.  ?Psychiatric:     ?   Mood and Affect: Mood is not anxious or depressed.  ? ? ?Wt Readings from Last 3 Encounters:  ?01/15/22 137 lb (62.1 kg)  ?06/08/21 137 lb (62.1 kg)  ?05/30/21 135 lb (61.2 kg)  ? ? ?BP 130/80   Pulse 80   Ht 5' 3.5" (1.613 m)   Wt 137 lb (62.1 kg)   BMI 23.89 kg/m?  ? ?Assessment and Plan: ? ?1. Elevated blood pressure, situational ?Chronic.  Episodic.  Currently excellent control 130/80.  We will continue with monitoring sodium intake and exercising and in general a maintaining weight which is slight increase but is within her usual range of weight control. ? ? ?

## 2022-01-17 DIAGNOSIS — H353132 Nonexudative age-related macular degeneration, bilateral, intermediate dry stage: Secondary | ICD-10-CM | POA: Diagnosis not present

## 2022-03-07 DIAGNOSIS — H353221 Exudative age-related macular degeneration, left eye, with active choroidal neovascularization: Secondary | ICD-10-CM | POA: Diagnosis not present

## 2022-04-20 ENCOUNTER — Other Ambulatory Visit: Payer: Self-pay

## 2022-04-20 ENCOUNTER — Telehealth: Payer: Self-pay | Admitting: Family Medicine

## 2022-04-20 DIAGNOSIS — J4540 Moderate persistent asthma, uncomplicated: Secondary | ICD-10-CM

## 2022-04-20 MED ORDER — ALBUTEROL SULFATE HFA 108 (90 BASE) MCG/ACT IN AERS: 1.0000 | INHALATION_SPRAY | Freq: Four times a day (QID) | RESPIRATORY_TRACT | 5 refills | Status: DC | PRN

## 2022-04-20 NOTE — Progress Notes (Signed)
Sent in proair 

## 2022-04-20 NOTE — Telephone Encounter (Signed)
Copied from CRM [id number]. Topic: Appointment Scheduling - Scheduling Inquiry for Clinic >> Apr 20, 2022  3:02 PM Turkey B wrote: Reason for DGL:[id number]'I mother called in to schedule TB test. She isn't sure if its the skin test or blood test. Patient is also going to to [facility] and needs forms filled out >> Apr 20, 2022  3:06 PM Franchot Heidelberg wrote: [phone] please call this instead

## 2022-04-24 ENCOUNTER — Encounter: Payer: Self-pay | Admitting: Family Medicine

## 2022-04-24 ENCOUNTER — Ambulatory Visit (INDEPENDENT_AMBULATORY_CARE_PROVIDER_SITE_OTHER): Payer: Medicare Other | Admitting: Family Medicine

## 2022-04-24 VITALS — BP 124/80 | HR 60 | Ht 63.5 in | Wt 132.0 lb

## 2022-04-24 DIAGNOSIS — Z022 Encounter for examination for admission to residential institution: Secondary | ICD-10-CM | POA: Diagnosis not present

## 2022-04-24 DIAGNOSIS — F5101 Primary insomnia: Secondary | ICD-10-CM

## 2022-04-24 MED ORDER — TRAZODONE HCL 50 MG PO TABS: 25.0000 mg | ORAL_TABLET | Freq: Every evening | ORAL | 3 refills | Status: DC | PRN

## 2022-04-24 NOTE — Progress Notes (Signed)
Date:  04/24/2022   Name:  Amy Shah   DOB:  March 10, 1935   MRN:  [id number]   Chief Complaint: nursing home [facility] for nursing home placement) and Macular Degeneration (Pt is followed by Dr Edison Pace at [facility])  Patient is a 87 year old female who presents for a medical for assisted living exam. The patient reports the following problems: none. Health maintenance has been reviewed up to date.up to date.    Insomnia Primary symptoms: fragmented sleep, difficulty falling asleep.   The current episode started more than one year. The problem occurs nightly. The symptoms are aggravated by anxiety.    Lab Results  Component Value Date   NA 140 06/08/2021   K 4.6 06/08/2021   CO2 23 06/08/2021   GLUCOSE 80 06/08/2021   BUN 21 06/08/2021   CREATININE 0.80 06/08/2021   CALCIUM 9.2 06/08/2021   EGFR 73 06/08/2021   GFRNONAA 71 04/13/2020   Lab Results  Component Value Date   CHOL 201 (H) 06/08/2021   HDL 67 06/08/2021   LDLCALC 123 (H) 06/08/2021   TRIG 58 06/08/2021   Lab Results  Component Value Date   TSH 2.400 04/13/2020   No results found for: "HGBA1C" Lab Results  Component Value Date   WBC 4.5 04/13/2020   HGB 13.2 04/13/2020   HCT 39.2 04/13/2020   MCV 98 (H) 04/13/2020   PLT 185 04/13/2020   Lab Results  Component Value Date   ALT 14 06/08/2021   AST 22 06/08/2021   ALKPHOS 96 06/08/2021   BILITOT 0.4 06/08/2021   No results found for: "25OHVITD2", "25OHVITD3", "VD25OH"   Review of Systems  Constitutional: Negative.  Negative for chills, fatigue, fever and unexpected weight change.  Respiratory:  Negative for cough, shortness of breath and wheezing.   Cardiovascular:  Negative for chest pain and palpitations.  Gastrointestinal:  Negative for abdominal pain.  Genitourinary:  Negative for dysuria.  Skin:  Negative for rash.  Neurological:  Negative for dizziness, weakness and headaches.  Psychiatric/Behavioral:  The patient has insomnia.      Patient Active Problem List   Diagnosis Date Noted   History of partial hysterectomy 12/22/2020   Osteoarthritis of hip 08/28/2018   Degeneration of lumbar intervertebral disc 10/31/2017   History of total hip arthroplasty 10/31/2017   Sacroiliac joint pain 10/31/2017   Scoliosis of lumbar spine 10/31/2017   Risk for falls 03/22/2017    Allergies  Allergen Reactions   Codeine Nausea Only and Other (See Comments)    Dizzy and nausea   Methocarbamol Other (See Comments)    Other reaction(s): Dizziness   Tetanus Toxoids Swelling and Other (See Comments)    Swelling and fever   Zetia [Ezetimibe]     Lips swelling, burning   Sulfa Antibiotics Rash    Past Surgical History:  Procedure Laterality Date   ABDOMINAL HYSTERECTOMY     partial/ left cervix   CATARACT EXTRACTION     COLON SURGERY     colectomy- removed polyp in cecrum- benign   JOINT REPLACEMENT Left    hip   TONSILLECTOMY      Social History   Tobacco Use   Smoking status: Never   Smokeless tobacco: Never  Vaping Use   Vaping Use: Never used  Substance Use Topics   Alcohol use: Not Currently   Drug use: No     Medication list has been reviewed and updated.  Current Meds  Medication Sig  acetaminophen (TYLENOL) 500 MG tablet Take 1,000 mg by mouth 2 (two) times daily.   albuterol (PROAIR HFA) 108 (90 Base) MCG/ACT inhaler Inhale 1 puff into the lungs every 6 (six) hours as needed.   amoxicillin (AMOXIL) 500 MG capsule Take 500 mg by mouth. 4 caps 1 hr before dental procedure   calcium carbonate (TUMS - DOSED IN MG ELEMENTAL CALCIUM) 500 MG chewable tablet Chew 1 tablet by mouth as needed for indigestion or heartburn. For reflux   carboxymethylcellulose (REFRESH PLUS) 0.5 % SOLN 1 drop daily as needed.   Cholecalciferol (VITAMIN D-3) 25 MCG (1000 UT) CAPS Take 1 capsule by mouth daily.   Ibuprofen 200 MG CAPS Take by mouth.   meclizine (ANTIVERT) 12.5 MG tablet Take 12.5 mg by mouth as needed for  dizziness. dizziness   Multiple Vitamins-Minerals (PRESERVISION AREDS PO) Take 2 tablets by mouth daily.       04/24/2022   10:18 AM 01/15/2022   10:52 AM 06/08/2021   10:18 AM 05/30/2021    2:19 PM  GAD 7 : Generalized Anxiety Score  Nervous, Anxious, on Edge 3 0 0 0  Control/stop worrying 1 0 0 0  Worry too much - different things 1 0 0 0  Trouble relaxing 3 0 0 0  Restless 3 0 0 0  Easily annoyed or irritable 0 0 0 0  Afraid - awful might happen 0 0 0 0  Total GAD 7 Score 11 0 0 0  Anxiety Difficulty Not difficult at all Not difficult at all         04/24/2022   10:18 AM 01/15/2022   10:52 AM 07/26/2021    2:09 PM  Depression screen PHQ 2/9  Decreased Interest 0 0 0  Down, Depressed, Hopeless 0 0 0  PHQ - 2 Score 0 0 0  Altered sleeping 2 0 1  Tired, decreased energy 3 0 0  Change in appetite 0 0 0  Feeling bad or failure about yourself  0 0 0  Trouble concentrating 0 0 0  Moving slowly or fidgety/restless 3 0 0  Suicidal thoughts 0 0 0  PHQ-9 Score 8 0 1  Difficult doing work/chores Extremely dIfficult Not difficult at all     BP Readings from Last 3 Encounters:  04/24/22 124/80  01/15/22 130/80  06/08/21 120/80    Physical Exam Vitals and nursing note reviewed.  HENT:     Right Ear: Tympanic membrane normal.     Left Ear: Tympanic membrane normal.     Nose: Nose normal.  Cardiovascular:     Rate and Rhythm: Normal rate.     Heart sounds: No murmur heard.    No gallop.  Pulmonary:     Breath sounds: No wheezing, rhonchi or rales.  Abdominal:     Tenderness: There is no abdominal tenderness. There is no guarding.  Neurological:     Mental Status: She is alert.     Wt Readings from Last 3 Encounters:  04/24/22 132 lb (59.9 kg)  01/15/22 137 lb (62.1 kg)  06/08/21 137 lb (62.1 kg)    BP 124/80   Pulse 60   Ht 5' 3.5" (1.613 m)   Wt 132 lb (59.9 kg)   BMI 23.02 kg/m   Assessment and Plan:  1. Primary insomnia Chronic.  Episodic.  Occurs most  every night with awakening and rehashing problems at night.  We will initiate with trazodone 1/2 to 1 tablet nightly for initial trial and continue  if tolerated. - traZODone (DESYREL) 50 MG tablet; Take 0.5-1 tablets (25-50 mg total) by mouth at bedtime as needed for sleep.  Dispense: 30 tablet; Refill: 3  2. Encounter for examination for admission to assisted living facility Patient presents for paperwork for admission to assisted living including tuberculosis surveillance. - QuantiFERON-TB Gold Plus

## 2022-04-27 ENCOUNTER — Telehealth: Payer: Self-pay

## 2022-04-27 LAB — QUANTIFERON-TB GOLD PLUS
QuantiFERON Mitogen Value: >10 IU/mL
QuantiFERON Nil Value: 0.02 IU/mL
QuantiFERON TB1 Ag Value: 0.01 IU/mL
QuantiFERON TB2 Ag Value: 0 IU/mL
QuantiFERON-TB Gold Plus: NEGATIVE

## 2022-04-27 NOTE — Telephone Encounter (Signed)
Pt stated she spoke with Delice Bison to send information of TB results to : [facility] fax number [phone].  Address is [facility] [address] [place], [place]. [address].

## 2022-05-09 DIAGNOSIS — H353221 Exudative age-related macular degeneration, left eye, with active choroidal neovascularization: Secondary | ICD-10-CM | POA: Diagnosis not present

## 2022-05-23 DIAGNOSIS — M4125 Other idiopathic scoliosis, thoracolumbar region: Secondary | ICD-10-CM | POA: Diagnosis not present

## 2022-05-23 DIAGNOSIS — H3530 Unspecified macular degeneration: Secondary | ICD-10-CM | POA: Diagnosis not present

## 2022-05-23 DIAGNOSIS — R269 Unspecified abnormalities of gait and mobility: Secondary | ICD-10-CM | POA: Diagnosis not present

## 2022-05-23 DIAGNOSIS — M130 Polyarthritis, unspecified: Secondary | ICD-10-CM | POA: Diagnosis not present

## 2022-06-01 DIAGNOSIS — K449 Diaphragmatic hernia without obstruction or gangrene: Secondary | ICD-10-CM | POA: Diagnosis not present

## 2022-06-01 DIAGNOSIS — R269 Unspecified abnormalities of gait and mobility: Secondary | ICD-10-CM | POA: Diagnosis not present

## 2022-06-01 DIAGNOSIS — Z96642 Presence of left artificial hip joint: Secondary | ICD-10-CM | POA: Diagnosis not present

## 2022-06-01 DIAGNOSIS — R251 Tremor, unspecified: Secondary | ICD-10-CM | POA: Diagnosis not present

## 2022-06-01 DIAGNOSIS — M4125 Other idiopathic scoliosis, thoracolumbar region: Secondary | ICD-10-CM | POA: Diagnosis not present

## 2022-06-01 DIAGNOSIS — H3530 Unspecified macular degeneration: Secondary | ICD-10-CM | POA: Diagnosis not present

## 2022-06-01 DIAGNOSIS — J479 Bronchiectasis, uncomplicated: Secondary | ICD-10-CM | POA: Diagnosis not present

## 2022-06-01 DIAGNOSIS — J449 Chronic obstructive pulmonary disease, unspecified: Secondary | ICD-10-CM | POA: Diagnosis not present

## 2022-06-01 DIAGNOSIS — M130 Polyarthritis, unspecified: Secondary | ICD-10-CM | POA: Diagnosis not present

## 2022-06-05 DIAGNOSIS — J479 Bronchiectasis, uncomplicated: Secondary | ICD-10-CM | POA: Diagnosis not present

## 2022-06-05 DIAGNOSIS — R269 Unspecified abnormalities of gait and mobility: Secondary | ICD-10-CM | POA: Diagnosis not present

## 2022-06-05 DIAGNOSIS — J449 Chronic obstructive pulmonary disease, unspecified: Secondary | ICD-10-CM | POA: Diagnosis not present

## 2022-06-05 DIAGNOSIS — M130 Polyarthritis, unspecified: Secondary | ICD-10-CM | POA: Diagnosis not present

## 2022-06-05 DIAGNOSIS — R251 Tremor, unspecified: Secondary | ICD-10-CM | POA: Diagnosis not present

## 2022-06-05 DIAGNOSIS — M4125 Other idiopathic scoliosis, thoracolumbar region: Secondary | ICD-10-CM | POA: Diagnosis not present

## 2022-06-06 DIAGNOSIS — R251 Tremor, unspecified: Secondary | ICD-10-CM | POA: Diagnosis not present

## 2022-06-06 DIAGNOSIS — J479 Bronchiectasis, uncomplicated: Secondary | ICD-10-CM | POA: Diagnosis not present

## 2022-06-06 DIAGNOSIS — R269 Unspecified abnormalities of gait and mobility: Secondary | ICD-10-CM | POA: Diagnosis not present

## 2022-06-06 DIAGNOSIS — M4125 Other idiopathic scoliosis, thoracolumbar region: Secondary | ICD-10-CM | POA: Diagnosis not present

## 2022-06-06 DIAGNOSIS — M130 Polyarthritis, unspecified: Secondary | ICD-10-CM | POA: Diagnosis not present

## 2022-06-06 DIAGNOSIS — J449 Chronic obstructive pulmonary disease, unspecified: Secondary | ICD-10-CM | POA: Diagnosis not present

## 2022-06-14 DIAGNOSIS — M130 Polyarthritis, unspecified: Secondary | ICD-10-CM | POA: Diagnosis not present

## 2022-06-14 DIAGNOSIS — R269 Unspecified abnormalities of gait and mobility: Secondary | ICD-10-CM | POA: Diagnosis not present

## 2022-06-14 DIAGNOSIS — J479 Bronchiectasis, uncomplicated: Secondary | ICD-10-CM | POA: Diagnosis not present

## 2022-06-14 DIAGNOSIS — M4125 Other idiopathic scoliosis, thoracolumbar region: Secondary | ICD-10-CM | POA: Diagnosis not present

## 2022-06-14 DIAGNOSIS — J449 Chronic obstructive pulmonary disease, unspecified: Secondary | ICD-10-CM | POA: Diagnosis not present

## 2022-06-14 DIAGNOSIS — R251 Tremor, unspecified: Secondary | ICD-10-CM | POA: Diagnosis not present

## 2022-06-18 DIAGNOSIS — J479 Bronchiectasis, uncomplicated: Secondary | ICD-10-CM | POA: Diagnosis not present

## 2022-06-18 DIAGNOSIS — M4125 Other idiopathic scoliosis, thoracolumbar region: Secondary | ICD-10-CM | POA: Diagnosis not present

## 2022-06-18 DIAGNOSIS — J449 Chronic obstructive pulmonary disease, unspecified: Secondary | ICD-10-CM | POA: Diagnosis not present

## 2022-06-18 DIAGNOSIS — R269 Unspecified abnormalities of gait and mobility: Secondary | ICD-10-CM | POA: Diagnosis not present

## 2022-06-18 DIAGNOSIS — M130 Polyarthritis, unspecified: Secondary | ICD-10-CM | POA: Diagnosis not present

## 2022-06-18 DIAGNOSIS — R251 Tremor, unspecified: Secondary | ICD-10-CM | POA: Diagnosis not present

## 2022-06-20 DIAGNOSIS — M130 Polyarthritis, unspecified: Secondary | ICD-10-CM | POA: Diagnosis not present

## 2022-06-20 DIAGNOSIS — E559 Vitamin D deficiency, unspecified: Secondary | ICD-10-CM | POA: Diagnosis not present

## 2022-06-20 DIAGNOSIS — R269 Unspecified abnormalities of gait and mobility: Secondary | ICD-10-CM | POA: Diagnosis not present

## 2022-06-25 DIAGNOSIS — M130 Polyarthritis, unspecified: Secondary | ICD-10-CM | POA: Diagnosis not present

## 2022-06-25 DIAGNOSIS — R251 Tremor, unspecified: Secondary | ICD-10-CM | POA: Diagnosis not present

## 2022-06-25 DIAGNOSIS — J449 Chronic obstructive pulmonary disease, unspecified: Secondary | ICD-10-CM | POA: Diagnosis not present

## 2022-06-25 DIAGNOSIS — M4125 Other idiopathic scoliosis, thoracolumbar region: Secondary | ICD-10-CM | POA: Diagnosis not present

## 2022-06-25 DIAGNOSIS — J479 Bronchiectasis, uncomplicated: Secondary | ICD-10-CM | POA: Diagnosis not present

## 2022-06-25 DIAGNOSIS — R269 Unspecified abnormalities of gait and mobility: Secondary | ICD-10-CM | POA: Diagnosis not present

## 2022-07-01 DIAGNOSIS — Z96642 Presence of left artificial hip joint: Secondary | ICD-10-CM | POA: Diagnosis not present

## 2022-07-01 DIAGNOSIS — K449 Diaphragmatic hernia without obstruction or gangrene: Secondary | ICD-10-CM | POA: Diagnosis not present

## 2022-07-01 DIAGNOSIS — M130 Polyarthritis, unspecified: Secondary | ICD-10-CM | POA: Diagnosis not present

## 2022-07-01 DIAGNOSIS — H3530 Unspecified macular degeneration: Secondary | ICD-10-CM | POA: Diagnosis not present

## 2022-07-01 DIAGNOSIS — J479 Bronchiectasis, uncomplicated: Secondary | ICD-10-CM | POA: Diagnosis not present

## 2022-07-01 DIAGNOSIS — J449 Chronic obstructive pulmonary disease, unspecified: Secondary | ICD-10-CM | POA: Diagnosis not present

## 2022-07-01 DIAGNOSIS — M4125 Other idiopathic scoliosis, thoracolumbar region: Secondary | ICD-10-CM | POA: Diagnosis not present

## 2022-07-01 DIAGNOSIS — R251 Tremor, unspecified: Secondary | ICD-10-CM | POA: Diagnosis not present

## 2022-07-01 DIAGNOSIS — R269 Unspecified abnormalities of gait and mobility: Secondary | ICD-10-CM | POA: Diagnosis not present

## 2022-07-02 DIAGNOSIS — J479 Bronchiectasis, uncomplicated: Secondary | ICD-10-CM | POA: Diagnosis not present

## 2022-07-02 DIAGNOSIS — R269 Unspecified abnormalities of gait and mobility: Secondary | ICD-10-CM | POA: Diagnosis not present

## 2022-07-02 DIAGNOSIS — R251 Tremor, unspecified: Secondary | ICD-10-CM | POA: Diagnosis not present

## 2022-07-02 DIAGNOSIS — M4125 Other idiopathic scoliosis, thoracolumbar region: Secondary | ICD-10-CM | POA: Diagnosis not present

## 2022-07-02 DIAGNOSIS — M130 Polyarthritis, unspecified: Secondary | ICD-10-CM | POA: Diagnosis not present

## 2022-07-02 DIAGNOSIS — J449 Chronic obstructive pulmonary disease, unspecified: Secondary | ICD-10-CM | POA: Diagnosis not present

## 2022-07-09 DIAGNOSIS — Q763 Congenital scoliosis due to congenital bony malformation: Secondary | ICD-10-CM | POA: Diagnosis not present

## 2022-07-09 DIAGNOSIS — R2689 Other abnormalities of gait and mobility: Secondary | ICD-10-CM | POA: Diagnosis not present

## 2022-07-09 DIAGNOSIS — H353131 Nonexudative age-related macular degeneration, bilateral, early dry stage: Secondary | ICD-10-CM | POA: Diagnosis not present

## 2022-07-09 DIAGNOSIS — J40 Bronchitis, not specified as acute or chronic: Secondary | ICD-10-CM | POA: Diagnosis not present

## 2022-07-11 DIAGNOSIS — R251 Tremor, unspecified: Secondary | ICD-10-CM | POA: Diagnosis not present

## 2022-07-11 DIAGNOSIS — J449 Chronic obstructive pulmonary disease, unspecified: Secondary | ICD-10-CM | POA: Diagnosis not present

## 2022-07-11 DIAGNOSIS — M130 Polyarthritis, unspecified: Secondary | ICD-10-CM | POA: Diagnosis not present

## 2022-07-11 DIAGNOSIS — R269 Unspecified abnormalities of gait and mobility: Secondary | ICD-10-CM | POA: Diagnosis not present

## 2022-07-11 DIAGNOSIS — J479 Bronchiectasis, uncomplicated: Secondary | ICD-10-CM | POA: Diagnosis not present

## 2022-07-11 DIAGNOSIS — M4125 Other idiopathic scoliosis, thoracolumbar region: Secondary | ICD-10-CM | POA: Diagnosis not present

## 2022-07-16 ENCOUNTER — Telehealth: Payer: Self-pay | Admitting: Family Medicine

## 2022-07-16 DIAGNOSIS — M4125 Other idiopathic scoliosis, thoracolumbar region: Secondary | ICD-10-CM | POA: Diagnosis not present

## 2022-07-16 DIAGNOSIS — J449 Chronic obstructive pulmonary disease, unspecified: Secondary | ICD-10-CM | POA: Diagnosis not present

## 2022-07-16 DIAGNOSIS — M130 Polyarthritis, unspecified: Secondary | ICD-10-CM | POA: Diagnosis not present

## 2022-07-16 DIAGNOSIS — R269 Unspecified abnormalities of gait and mobility: Secondary | ICD-10-CM | POA: Diagnosis not present

## 2022-07-16 DIAGNOSIS — J479 Bronchiectasis, uncomplicated: Secondary | ICD-10-CM | POA: Diagnosis not present

## 2022-07-16 DIAGNOSIS — R251 Tremor, unspecified: Secondary | ICD-10-CM | POA: Diagnosis not present

## 2022-07-16 NOTE — Telephone Encounter (Signed)
Printed and Faxed  KP

## 2022-07-16 NOTE — Telephone Encounter (Signed)
Kelly with [facility] needs a copy of patients PPD faxed to them  [facility]  [phone]

## 2022-07-18 DIAGNOSIS — M130 Polyarthritis, unspecified: Secondary | ICD-10-CM | POA: Diagnosis not present

## 2022-07-18 DIAGNOSIS — H353221 Exudative age-related macular degeneration, left eye, with active choroidal neovascularization: Secondary | ICD-10-CM | POA: Diagnosis not present

## 2022-07-18 DIAGNOSIS — R2689 Other abnormalities of gait and mobility: Secondary | ICD-10-CM | POA: Diagnosis not present

## 2022-07-18 DIAGNOSIS — E559 Vitamin D deficiency, unspecified: Secondary | ICD-10-CM | POA: Diagnosis not present

## 2022-07-26 DIAGNOSIS — J479 Bronchiectasis, uncomplicated: Secondary | ICD-10-CM | POA: Diagnosis not present

## 2022-07-26 DIAGNOSIS — R251 Tremor, unspecified: Secondary | ICD-10-CM | POA: Diagnosis not present

## 2022-07-26 DIAGNOSIS — R269 Unspecified abnormalities of gait and mobility: Secondary | ICD-10-CM | POA: Diagnosis not present

## 2022-07-26 DIAGNOSIS — M130 Polyarthritis, unspecified: Secondary | ICD-10-CM | POA: Diagnosis not present

## 2022-07-26 DIAGNOSIS — M4125 Other idiopathic scoliosis, thoracolumbar region: Secondary | ICD-10-CM | POA: Diagnosis not present

## 2022-07-26 DIAGNOSIS — J449 Chronic obstructive pulmonary disease, unspecified: Secondary | ICD-10-CM | POA: Diagnosis not present

## 2022-07-27 ENCOUNTER — Emergency Department
Admission: EM | Admit: 2022-07-27 | Discharge: 2022-07-27 | Disposition: A | Discharge status: Home / Self Care | Payer: Medicare Other | Attending: Emergency Medicine | Admitting: Emergency Medicine

## 2022-07-27 ENCOUNTER — Other Ambulatory Visit: Payer: Self-pay

## 2022-07-27 ENCOUNTER — Ambulatory Visit: Payer: Medicare Other

## 2022-07-27 DIAGNOSIS — I10 Essential (primary) hypertension: Secondary | ICD-10-CM | POA: Insufficient documentation

## 2022-07-27 DIAGNOSIS — R5381 Other malaise: Secondary | ICD-10-CM | POA: Diagnosis not present

## 2022-07-27 DIAGNOSIS — E860 Dehydration: Secondary | ICD-10-CM | POA: Diagnosis not present

## 2022-07-27 DIAGNOSIS — R110 Nausea: Secondary | ICD-10-CM | POA: Diagnosis not present

## 2022-07-27 DIAGNOSIS — R531 Weakness: Secondary | ICD-10-CM | POA: Diagnosis present

## 2022-07-27 LAB — BASIC METABOLIC PANEL
Anion gap: 8 (ref 5–15)
BUN: 14 mg/dL (ref 8–23)
CO2: 25 mmol/L (ref 22–32)
Calcium: 9.1 mg/dL (ref 8.9–10.3)
Chloride: 106 mmol/L (ref 98–111)
Creatinine, Ser: 0.61 mg/dL (ref 0.44–1.00)
GFR, Estimated: >60 mL/min (ref >60)
Glucose, Bld: 89 mg/dL (ref 70–99)
Potassium: 3.8 mmol/L (ref 3.5–5.1)
Sodium: 139 mmol/L (ref 135–145)

## 2022-07-27 LAB — URINALYSIS, ROUTINE W REFLEX MICROSCOPIC
Bilirubin Urine: NEGATIVE
Glucose, UA: NEGATIVE mg/dL
Hgb urine dipstick: NEGATIVE
Ketones, ur: NEGATIVE mg/dL
Leukocytes,Ua: NEGATIVE
Nitrite: NEGATIVE
Protein, ur: NEGATIVE mg/dL
Specific Gravity, Urine: 1.003 — ABNORMAL LOW (ref 1.005–1.030)
pH: 8 (ref 5.0–8.0)

## 2022-07-27 LAB — CBC
HCT: 39.6 % (ref 36.0–46.0)
Hemoglobin: 12.9 g/dL (ref 12.0–15.0)
MCH: 32.1 pg (ref 26.0–34.0)
MCHC: 32.6 g/dL (ref 30.0–36.0)
MCV: 98.5 fL (ref 80.0–100.0)
Platelets: 204 10*3/uL (ref 150–400)
RBC: 4.02 MIL/uL (ref 3.87–5.11)
RDW: 12.2 % (ref 11.5–15.5)
WBC: 3.9 10*3/uL — ABNORMAL LOW (ref 4.0–10.5)
nRBC: 0 % (ref 0.0–0.2)

## 2022-07-27 LAB — TROPONIN I (HIGH SENSITIVITY): Troponin I (High Sensitivity): 10 ng/L (ref <18)

## 2022-07-27 MED ORDER — SODIUM CHLORIDE 0.9 % IV SOLN: Freq: Once | INTRAVENOUS | Status: AC

## 2022-07-27 NOTE — ED Triage Notes (Signed)
Pt. To ED via EMS for diarrhea and weakness x several weeks. Pt. States she had GI virus several weeks ago and hasn't felt right since.

## 2022-07-27 NOTE — ED Provider Notes (Signed)
[facility] Provider Note    Event Date/Time   First MD Initiated Contact with Patient 07/27/22 1134     (approximate)   History   Loose stools and weakness   HPI  Amy Shah is a 87 y.o. female with history of hypertension who presents with complaints of weakness this morning.  She reports she felt shaky and somewhat lightheaded.  She notes over the last week or 2 she has had loose stools after a GI illness.  She thinks that she may be dehydrated.  She denies pain.  No headache.  No neurodeficits     Physical Exam   Triage Vital Signs: ED Triage Vitals [07/27/22 1026]  Enc Vitals Group     BP (!) 173/83     Pulse Rate 68     Resp 18     Temp 98.4 F (36.9 C)     Temp Source Oral     SpO2 96 %     Weight 59.9 kg (132 lb)     Height 1.6 m (5\' 3" )     Head Circumference      Peak Flow      Pain Score 0     Pain Loc      Pain Edu?      Excl. in [place]?     Most recent vital signs: Vitals:   07/27/22 1026  BP: (!) 173/83  Pulse: 68  Resp: 18  Temp: 98.4 F (36.9 C)  SpO2: 96%     General: Awake, no distress.  CV:  Good peripheral perfusion.  Resp:  Normal effort.  Abd:  No distention.  Other:  ENT: Tongue is dry   ED Results / Procedures / Treatments   Labs (all labs ordered are listed, but only abnormal results are displayed) Labs Reviewed  CBC - Abnormal; Notable for the following components:      Result Value   WBC 3.9 (*)    All other components within normal limits  URINALYSIS, ROUTINE W REFLEX MICROSCOPIC - Abnormal; Notable for the following components:   Color, Urine COLORLESS (*)    APPearance CLEAR (*)    Specific Gravity, Urine 1.003 (*)    All other components within normal limits  BASIC METABOLIC PANEL  CBG MONITORING, ED  TROPONIN I (HIGH SENSITIVITY)     EKG ED ECG REPORT I, Lavonia Drafts, the attending physician, personally viewed and interpreted this ECG.  Date: 07/27/2022  Rhythm: normal  sinus rhythm QRS Axis: normal Intervals: normal ST/T Wave abnormalities: Nonspecific changes Narrative Interpretation: no evidence of acute ischemia     RADIOLOGY None    PROCEDURES:  Critical Care performed:   Procedures   MEDICATIONS ORDERED IN ED: Medications  0.9 %  sodium chloride infusion ( Intravenous New Bag/Given 07/27/22 1248)     IMPRESSION / MDM / ASSESSMENT AND PLAN / ED COURSE  I reviewed the triage vital signs and the nursing notes. Patient's presentation is most consistent with acute presentation with potential threat to life or bodily function.  Patient presents with dizziness as detailed above.  Defer she will includes dehydration, electrolyte abnormalities, arrhythmia however patient does not complain any palpitations or chest pain.  Has had multiple loose stools, suspicious for dehydration,   Clinically she does appear dehydrated, will give IV fluids  Lab work reviewed and is overall reassuring, normal white blood cell count, normal BMP, normal high sensitive troponin  After fluids patient is feeling much improved, appropriate discharge  at this time with outpatient follow-up.  She agrees to this plan.        FINAL CLINICAL IMPRESSION(S) / ED DIAGNOSES   Final diagnoses:  Dehydration     Rx / DC Orders   ED Discharge Orders     None        Note:  This document was prepared using Dragon voice recognition software and may include unintentional dictation errors.   Lavonia Drafts, MD 07/27/22 1336

## 2022-07-27 NOTE — ED Notes (Signed)
First Nurse Note: Pt to ED via [facility] from [facility] for not feeling well. Pt has had nausea and reports being very shaky. Pt is A & O x 4, pt is ambulatory.

## 2022-07-30 ENCOUNTER — Ambulatory Visit: Payer: Medicare Other

## 2022-07-31 DIAGNOSIS — J479 Bronchiectasis, uncomplicated: Secondary | ICD-10-CM | POA: Diagnosis not present

## 2022-07-31 DIAGNOSIS — H3530 Unspecified macular degeneration: Secondary | ICD-10-CM | POA: Diagnosis not present

## 2022-07-31 DIAGNOSIS — K449 Diaphragmatic hernia without obstruction or gangrene: Secondary | ICD-10-CM | POA: Diagnosis not present

## 2022-07-31 DIAGNOSIS — R251 Tremor, unspecified: Secondary | ICD-10-CM | POA: Diagnosis not present

## 2022-07-31 DIAGNOSIS — M4125 Other idiopathic scoliosis, thoracolumbar region: Secondary | ICD-10-CM | POA: Diagnosis not present

## 2022-07-31 DIAGNOSIS — Z96642 Presence of left artificial hip joint: Secondary | ICD-10-CM | POA: Diagnosis not present

## 2022-07-31 DIAGNOSIS — M130 Polyarthritis, unspecified: Secondary | ICD-10-CM | POA: Diagnosis not present

## 2022-07-31 DIAGNOSIS — R269 Unspecified abnormalities of gait and mobility: Secondary | ICD-10-CM | POA: Diagnosis not present

## 2022-07-31 DIAGNOSIS — J449 Chronic obstructive pulmonary disease, unspecified: Secondary | ICD-10-CM | POA: Diagnosis not present

## 2022-08-01 DIAGNOSIS — M130 Polyarthritis, unspecified: Secondary | ICD-10-CM | POA: Diagnosis not present

## 2022-08-01 DIAGNOSIS — J479 Bronchiectasis, uncomplicated: Secondary | ICD-10-CM | POA: Diagnosis not present

## 2022-08-01 DIAGNOSIS — M4125 Other idiopathic scoliosis, thoracolumbar region: Secondary | ICD-10-CM | POA: Diagnosis not present

## 2022-08-01 DIAGNOSIS — R269 Unspecified abnormalities of gait and mobility: Secondary | ICD-10-CM | POA: Diagnosis not present

## 2022-08-01 DIAGNOSIS — J449 Chronic obstructive pulmonary disease, unspecified: Secondary | ICD-10-CM | POA: Diagnosis not present

## 2022-08-01 DIAGNOSIS — R251 Tremor, unspecified: Secondary | ICD-10-CM | POA: Diagnosis not present

## 2022-08-06 DIAGNOSIS — R251 Tremor, unspecified: Secondary | ICD-10-CM | POA: Diagnosis not present

## 2022-08-06 DIAGNOSIS — R269 Unspecified abnormalities of gait and mobility: Secondary | ICD-10-CM | POA: Diagnosis not present

## 2022-08-06 DIAGNOSIS — M4125 Other idiopathic scoliosis, thoracolumbar region: Secondary | ICD-10-CM | POA: Diagnosis not present

## 2022-08-06 DIAGNOSIS — J449 Chronic obstructive pulmonary disease, unspecified: Secondary | ICD-10-CM | POA: Diagnosis not present

## 2022-08-06 DIAGNOSIS — J479 Bronchiectasis, uncomplicated: Secondary | ICD-10-CM | POA: Diagnosis not present

## 2022-08-06 DIAGNOSIS — M130 Polyarthritis, unspecified: Secondary | ICD-10-CM | POA: Diagnosis not present

## 2022-08-07 DIAGNOSIS — Z23 Encounter for immunization: Secondary | ICD-10-CM | POA: Diagnosis not present

## 2022-08-08 ENCOUNTER — Ambulatory Visit (INDEPENDENT_AMBULATORY_CARE_PROVIDER_SITE_OTHER): Payer: Medicare Other

## 2022-08-08 VITALS — Ht 63.0 in | Wt 132.0 lb

## 2022-08-08 DIAGNOSIS — Z0000 Encounter for general adult medical examination without abnormal findings: Secondary | ICD-10-CM | POA: Diagnosis not present

## 2022-08-08 NOTE — Patient Instructions (Signed)
Thank you for enrolling in [place]. Please follow the instructions below to securely access your online medical record. MyChart allows you to send messages to your doctor, view your test results, manage appointments, and more.   How Do I Sign Up? In your Internet browser, go to [employer] and enter [email or web address]. Click on the Sign Up Now link in the Sign In box. You will see the New Member Sign Up page. Enter your MyChart Access Code exactly as it appears below. You will not need to use this code after you've completed the sign-up process. If you do not sign up before the expiration date, you must request a new code.  MyChart Access Code: [email or web address] Expires: 09/13/2022  1:21 PM  Enter your Social Security Number ([id number]) and Date of Birth (mm/dd/yyyy) as indicated and click Submit. You will be taken to the next sign-up page. Create a MyChart ID. This will be your MyChart login ID and cannot be changed, so think of one that is secure and easy to remember. Create a Scientist, research (physical sciences). You can change your password at any time. Enter your Password Reset Question and Answer. This can be used at a later time if you forget your password.  Enter your e-mail address. You will receive e-mail notification when new information is available in [place]. Click Sign Up. You can now view your medical record.   Additional Information Remember, MyChart is NOT to be used for urgent needs. For medical emergencies, dial 911.

## 2022-08-08 NOTE — Progress Notes (Signed)
Subjective:   Amy Shah is a 87 y.o. female who presents for Medicare Annual (Subsequent) preventive examination.  I connected with  Amy Shah on 08/08/22 by a audio enabled telemedicine application and verified that I am speaking with the correct person using two identifiers.  Patient Location: Home  Provider Location: Office/Clinic  I discussed the limitations of evaluation and management by telemedicine. The patient expressed understanding and agreed to proceed.    Review of Systems    Defer to PCP Cardiac Risk Factors include: advanced age (>49men, >49 women)     Objective:    Today's Vitals   08/08/22 0959 08/08/22 1002  Weight: 132 lb (59.9 kg)   Height: 5\' 3"  (1.6 m)   PainSc: 0-No pain 0-No pain   Body mass index is 23.38 kg/m.     08/08/2022   10:07 AM 07/27/2022   10:28 AM 07/26/2021    2:10 PM 07/25/2020    1:52 PM 03/26/2015    2:45 PM  Advanced Directives  Does Patient Have a Medical Advance Directive? Yes Yes Yes Yes Yes  Type of Advance Directive Living will  [place];Living will [place];Living will [place];Living will  Copy of [place] in Chart?   No - copy requested No - copy requested Yes    Current Medications (verified) Outpatient Encounter Medications as of 08/08/2022  Medication Sig   acetaminophen (TYLENOL) 500 MG tablet Take 1,000 mg by mouth 2 (two) times daily.   albuterol (PROAIR HFA) 108 (90 Base) MCG/ACT inhaler Inhale 1 puff into the lungs every 6 (six) hours as needed.   amoxicillin (AMOXIL) 500 MG capsule Take 500 mg by mouth. 4 caps 1 hr before dental procedure   calcium carbonate (TUMS - DOSED IN MG ELEMENTAL CALCIUM) 500 MG chewable tablet Chew 1 tablet by mouth as needed for indigestion or heartburn. For reflux   carboxymethylcellulose (REFRESH PLUS) 0.5 % SOLN 1 drop daily as needed.   Cholecalciferol (VITAMIN D-3) 25 MCG (1000 UT) CAPS  Take 1 capsule by mouth daily.   Ibuprofen 200 MG CAPS Take by mouth.   meclizine (ANTIVERT) 12.5 MG tablet Take 12.5 mg by mouth as needed for dizziness. dizziness   Multiple Vitamins-Minerals (PRESERVISION AREDS PO) Take 2 tablets by mouth daily.   traZODone (DESYREL) 50 MG tablet Take 0.5-1 tablets (25-50 mg total) by mouth at bedtime as needed for sleep.   No facility-administered encounter medications on file as of 08/08/2022.    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   Lung abnormality    Right middle lobe syndrome   Macular degeneration    Shingles    Vertigo    Past Surgical History:  Procedure Laterality Date   ABDOMINAL HYSTERECTOMY     partial/ left cervix   CATARACT EXTRACTION     COLON SURGERY     colectomy- removed polyp in cecrum- benign   JOINT REPLACEMENT Left    hip   TONSILLECTOMY     Family History  Problem Relation Age of Onset   Cancer Mother    Stroke Father    Social History   Socioeconomic History   Marital status: Married    Spouse name: Not on file   Number  of children: 2   Years of education: Not on file   Highest education level: Not on file  Occupational History   Not on file  Tobacco Use   Smoking status: Never   Smokeless tobacco: Never  Vaping Use   Vaping Use: Never used  Substance and Sexual Activity   Alcohol use: Not Currently   Drug use: No   Sexual activity: Not Currently  Other Topics Concern   Not on file  Social History Narrative   Not on file   Social Determinants of Health   Financial Resource Strain: Low Risk  (08/08/2022)   Overall Financial Resource Strain (CARDIA)    Difficulty of Paying Living Expenses: Not hard at all  Food Insecurity: No Food Insecurity (08/08/2022)   Hunger Vital Sign    Worried  About Running Out of Food in the Last Year: Never true    Ran Out of Food in the Last Year: Never true  Transportation Needs: No Transportation Needs (08/08/2022)   PRAPARE - Hydrologist (Medical): No    Lack of Transportation (Non-Medical): No  Physical Activity: Inactive (08/08/2022)   Exercise Vital Sign    Days of Exercise per Week: 0 days    Minutes of Exercise per Session: 0 min  Stress: No Stress Concern Present (08/08/2022)   [place]    Feeling of Stress : Not at all  Social Connections: Moderately Isolated (08/08/2022)   Social Connection and Isolation Panel [NHANES]    Frequency of Communication with Friends and Family: More than three times a week    Frequency of Social Gatherings with Friends and Family: Three times a week    Attends Religious Services: Never    Active Member of Clubs or Organizations: No    Attends Archivist Meetings: Never    Marital Status: Married    Tobacco Counseling Counseling given: Not Answered   Clinical Intake:  Pre-visit preparation completed: Yes  Pain : No/denies pain Pain Score: 0-No pain     BMI - recorded: 23.38 Nutritional Risks: None Diabetes: No     Diabetic? No.     Information entered by :: Amy Shah, [place] [facility]    08/08/2022   10:08 AM  In your present state of health, do you have any difficulty performing the following activities:  Hearing? 0  Vision? 0  Difficulty concentrating or making decisions? 0  Walking or climbing stairs? 1  Dressing or bathing? 0  Doing errands, shopping? 1  Preparing Food and eating ? Y  Using the Toilet? N  In the past six months, have you accidently leaked urine? Y  Do you have problems with loss of bowel control? N  Managing your Medications? Y  Managing your Finances? Y  Housekeeping or managing your Housekeeping? Y    Patient Care  Team: Juline Patch, MD as PCP - General (Family Medicine)  Indicate any recent Medical Services you may have received from other than [facility] providers in the past year (date may be approximate).     Assessment:   This is a routine wellness examination for Amy Shah.  Hearing/Vision screen Hearing Screening - Comments:: No concerns. Vision Screening - Comments:: Wears glasses.  Dietary issues and exercise activities discussed: Current Exercise Habits: The patient does not participate in regular exercise at present   Goals Addressed  This Visit's Progress    DIET - INCREASE WATER INTAKE   On track    Recommend drinking 6-8 glasses of water per day       Depression Screen    08/08/2022   10:07 AM 04/24/2022   10:18 AM 01/15/2022   10:52 AM 07/26/2021    2:09 PM 06/08/2021   10:18 AM 05/30/2021    2:19 PM 12/22/2020   10:18 AM  PHQ 2/9 Scores  PHQ - 2 Score 0 0 0 0 0 0 0  PHQ- 9 Score 4 8 0 1 0 0 0    Fall Risk    08/08/2022   10:08 AM 04/24/2022   10:18 AM 01/15/2022   10:53 AM 07/26/2021    2:11 PM 06/08/2021   10:18 AM  Fall Risk   Falls in the past year? 0 0 0 0 0  Number falls in past yr: 0 0 0 0 0  Injury with Fall? 0 0 0 0 0  Risk for fall due to : No Fall Risks Impaired balance/gait No Fall Risks No Fall Risks No Fall Risks  Follow up Falls evaluation completed Falls evaluation completed Falls evaluation completed Falls prevention discussed Falls evaluation completed    FALL RISK PREVENTION PERTAINING TO THE HOME:  Any stairs in or around the home? No  If so, are there any without handrails?  N/A Home free of loose throw rugs in walkways, pet beds, electrical cords, etc? Yes  Adequate lighting in your home to reduce risk of falls? Yes   ASSISTIVE DEVICES UTILIZED TO PREVENT FALLS:  Life alert? Yes  Use of a cane, walker or w/c? Yes  Grab bars in the bathroom? Yes  Shower chair or bench in shower? Yes  Elevated toilet seat or a handicapped  toilet? Yes   Cognitive Function:        08/08/2022   10:09 AM 07/25/2020    1:55 PM  6CIT Screen  What Year? 0 points 0 points  What month? 0 points 0 points  What time? 0 points 0 points  Count back from 20 0 points 0 points  Months in reverse 0 points 0 points  Repeat phrase 0 points 0 points  Total Score 0 points 0 points    Immunizations Immunization History  Administered Date(s) Administered   Fluad Quad(high Dose 65+) 07/13/2021   Influenza, High Dose Seasonal PF 07/06/2015, 07/02/2016, 06/17/2019, 06/25/2020   Influenza,inj,Quad PF,6+ Mos 07/04/2018   Influenza-Unspecified 09/15/2008, 07/01/2017, 08/07/2022   PFIZER Comirnaty(Gray Top)Covid-19 Tri-Sucrose Vaccine 10/04/2020   PFIZER(Purple Top)SARS-COV-2 Vaccination 01/01/2020, 01/22/2020   Pneumococcal Conjugate-13 02/15/2015   Pneumococcal Polysaccharide-23 10/08/2010    TDAP status: Due, Education has been provided regarding the importance of this vaccine. Advised may receive this vaccine at local pharmacy or Health Dept. Aware to provide a copy of the vaccination record if obtained from local pharmacy or Health Dept. Verbalized acceptance and understanding.  Flu Vaccine status: Up to date  Pneumococcal vaccine status: Up to date  Covid-19 vaccine status: Completed vaccines  Qualifies for Shingles Vaccine? Yes   Zostavax completed No   Shingrix Completed?: No.    Education has been provided regarding the importance of this vaccine. Patient has been advised to call insurance company to determine out of pocket expense if they have not yet received this vaccine. Advised may also receive vaccine at local pharmacy or Health Dept. Verbalized acceptance and understanding.  Screening Tests Health Maintenance  Topic Date Due   COVID-19 Vaccine (4 -  Pfizer risk series) 08/24/2022 (Originally 11/29/2020)   Zoster Vaccines- Shingrix (1 of 2) 11/08/2022 (Originally 01/10/1954)   DEXA SCAN  01/16/2023 (Originally 01/09/2002)    TETANUS/TDAP  01/16/2023 (Originally 01/10/1954)   Medicare Annual Wellness (AWV)  08/09/2023   Pneumonia Vaccine 47+ Years old  Completed   INFLUENZA VACCINE  Completed   HPV VACCINES  Aged Out    Health Maintenance  There are no preventive care reminders to display for this patient.   Colorectal cancer screening: No longer required.   Mammogram status: No longer required due to age.   Lung Cancer Screening: (Low Dose CT Chest recommended if Age 24-80 years, 30 pack-year currently smoking OR have quit w/in 15years.) does not qualify.   Lung Cancer Screening Referral: N/A  Additional Screening:  Hepatitis C Screening: does not qualify;  Vision Screening: Recommended annual ophthalmology exams for early detection of glaucoma and other disorders of the eye. Is the patient up to date with their annual eye exam?  Yes  Who is the provider or what is the name of the office in which the patient attends annual eye exams? [place] Screening: Recommended annual dental exams for proper oral hygiene  Community Resource Referral / Chronic Care Management: CRR required this visit?  No   CCM required this visit?  No      Plan:     I have personally reviewed and noted the following in the patient's chart:   Medical and social history Use of alcohol, tobacco or illicit drugs  Current medications and supplements including opioid prescriptions. Patient is not currently taking opioid prescriptions. Functional ability and status Nutritional status Physical activity Advanced directives List of other physicians Hospitalizations, surgeries, and ER visits in previous 12 months Vitals Screenings to include cognitive, depression, and falls Referrals and appointments  In addition, I have reviewed and discussed with patient certain preventive protocols, quality metrics, and best practice recommendations. A written personalized care plan for preventive services as well  as general preventive health recommendations were provided to patient.     Amy Shah, [place]   08/08/2022    Amy Shah , Thank you for taking time to come for your Medicare Wellness Visit. I appreciate your ongoing commitment to your health goals. Please review the following plan we discussed and let me know if I can assist you in the future.   These are the goals we discussed:  Goals      DIET - INCREASE WATER INTAKE     Recommend drinking 6-8 glasses of water per day      Increase physical activity     Recommend increasing physical activity        This is a list of the screening recommended for you and due dates:  Health Maintenance  Topic Date Due   COVID-19 Vaccine (4 - Pfizer risk series) 08/24/2022*   Zoster (Shingles) Vaccine (1 of 2) 11/08/2022*   DEXA scan (bone density measurement)  01/16/2023*   Tetanus Vaccine  01/16/2023*   Medicare Annual Wellness Visit  08/09/2023   Pneumonia Vaccine  Completed   Flu Shot  Completed   HPV Vaccine  Aged Out  *Topic was postponed. The date shown is not the original due date.      Nurse Notes: None.

## 2022-08-15 DIAGNOSIS — M130 Polyarthritis, unspecified: Secondary | ICD-10-CM | POA: Diagnosis not present

## 2022-08-15 DIAGNOSIS — J479 Bronchiectasis, uncomplicated: Secondary | ICD-10-CM | POA: Diagnosis not present

## 2022-08-15 DIAGNOSIS — R269 Unspecified abnormalities of gait and mobility: Secondary | ICD-10-CM | POA: Diagnosis not present

## 2022-08-15 DIAGNOSIS — M4125 Other idiopathic scoliosis, thoracolumbar region: Secondary | ICD-10-CM | POA: Diagnosis not present

## 2022-08-15 DIAGNOSIS — R2689 Other abnormalities of gait and mobility: Secondary | ICD-10-CM | POA: Diagnosis not present

## 2022-08-15 DIAGNOSIS — R251 Tremor, unspecified: Secondary | ICD-10-CM | POA: Diagnosis not present

## 2022-08-15 DIAGNOSIS — E559 Vitamin D deficiency, unspecified: Secondary | ICD-10-CM | POA: Diagnosis not present

## 2022-08-15 DIAGNOSIS — J449 Chronic obstructive pulmonary disease, unspecified: Secondary | ICD-10-CM | POA: Diagnosis not present

## 2022-08-20 DIAGNOSIS — J449 Chronic obstructive pulmonary disease, unspecified: Secondary | ICD-10-CM | POA: Diagnosis not present

## 2022-08-20 DIAGNOSIS — M4125 Other idiopathic scoliosis, thoracolumbar region: Secondary | ICD-10-CM | POA: Diagnosis not present

## 2022-08-20 DIAGNOSIS — M130 Polyarthritis, unspecified: Secondary | ICD-10-CM | POA: Diagnosis not present

## 2022-08-20 DIAGNOSIS — R269 Unspecified abnormalities of gait and mobility: Secondary | ICD-10-CM | POA: Diagnosis not present

## 2022-08-20 DIAGNOSIS — R251 Tremor, unspecified: Secondary | ICD-10-CM | POA: Diagnosis not present

## 2022-08-20 DIAGNOSIS — J479 Bronchiectasis, uncomplicated: Secondary | ICD-10-CM | POA: Diagnosis not present

## 2022-08-23 DIAGNOSIS — J40 Bronchitis, not specified as acute or chronic: Secondary | ICD-10-CM | POA: Diagnosis not present

## 2022-08-30 DIAGNOSIS — Z96642 Presence of left artificial hip joint: Secondary | ICD-10-CM | POA: Diagnosis not present

## 2022-08-30 DIAGNOSIS — M4125 Other idiopathic scoliosis, thoracolumbar region: Secondary | ICD-10-CM | POA: Diagnosis not present

## 2022-08-30 DIAGNOSIS — J449 Chronic obstructive pulmonary disease, unspecified: Secondary | ICD-10-CM | POA: Diagnosis not present

## 2022-08-30 DIAGNOSIS — R251 Tremor, unspecified: Secondary | ICD-10-CM | POA: Diagnosis not present

## 2022-08-30 DIAGNOSIS — H3530 Unspecified macular degeneration: Secondary | ICD-10-CM | POA: Diagnosis not present

## 2022-08-30 DIAGNOSIS — R269 Unspecified abnormalities of gait and mobility: Secondary | ICD-10-CM | POA: Diagnosis not present

## 2022-08-30 DIAGNOSIS — M130 Polyarthritis, unspecified: Secondary | ICD-10-CM | POA: Diagnosis not present

## 2022-08-30 DIAGNOSIS — K449 Diaphragmatic hernia without obstruction or gangrene: Secondary | ICD-10-CM | POA: Diagnosis not present

## 2022-08-30 DIAGNOSIS — J479 Bronchiectasis, uncomplicated: Secondary | ICD-10-CM | POA: Diagnosis not present

## 2022-09-05 DIAGNOSIS — J449 Chronic obstructive pulmonary disease, unspecified: Secondary | ICD-10-CM | POA: Diagnosis not present

## 2022-09-05 DIAGNOSIS — R269 Unspecified abnormalities of gait and mobility: Secondary | ICD-10-CM | POA: Diagnosis not present

## 2022-09-05 DIAGNOSIS — R251 Tremor, unspecified: Secondary | ICD-10-CM | POA: Diagnosis not present

## 2022-09-05 DIAGNOSIS — M130 Polyarthritis, unspecified: Secondary | ICD-10-CM | POA: Diagnosis not present

## 2022-09-05 DIAGNOSIS — J479 Bronchiectasis, uncomplicated: Secondary | ICD-10-CM | POA: Diagnosis not present

## 2022-09-05 DIAGNOSIS — M4125 Other idiopathic scoliosis, thoracolumbar region: Secondary | ICD-10-CM | POA: Diagnosis not present

## 2022-09-26 DIAGNOSIS — E559 Vitamin D deficiency, unspecified: Secondary | ICD-10-CM | POA: Diagnosis not present

## 2022-09-26 DIAGNOSIS — M4125 Other idiopathic scoliosis, thoracolumbar region: Secondary | ICD-10-CM | POA: Diagnosis not present

## 2022-09-26 DIAGNOSIS — M130 Polyarthritis, unspecified: Secondary | ICD-10-CM | POA: Diagnosis not present

## 2022-09-26 DIAGNOSIS — R2689 Other abnormalities of gait and mobility: Secondary | ICD-10-CM | POA: Diagnosis not present

## 2022-09-26 DIAGNOSIS — H353221 Exudative age-related macular degeneration, left eye, with active choroidal neovascularization: Secondary | ICD-10-CM | POA: Diagnosis not present

## 2022-09-27 DIAGNOSIS — E559 Vitamin D deficiency, unspecified: Secondary | ICD-10-CM | POA: Diagnosis not present

## 2022-09-27 DIAGNOSIS — R2689 Other abnormalities of gait and mobility: Secondary | ICD-10-CM | POA: Diagnosis not present

## 2022-09-27 DIAGNOSIS — H353220 Exudative age-related macular degeneration, left eye, stage unspecified: Secondary | ICD-10-CM | POA: Diagnosis not present

## 2022-09-27 DIAGNOSIS — J9819 Other pulmonary collapse: Secondary | ICD-10-CM | POA: Diagnosis not present

## 2022-09-27 DIAGNOSIS — R251 Tremor, unspecified: Secondary | ICD-10-CM | POA: Diagnosis not present

## 2022-09-27 DIAGNOSIS — M419 Scoliosis, unspecified: Secondary | ICD-10-CM | POA: Diagnosis not present

## 2022-10-06 DIAGNOSIS — R059 Cough, unspecified: Secondary | ICD-10-CM | POA: Diagnosis not present

## 2022-10-10 DIAGNOSIS — R051 Acute cough: Secondary | ICD-10-CM | POA: Diagnosis not present

## 2022-10-10 DIAGNOSIS — J181 Lobar pneumonia, unspecified organism: Secondary | ICD-10-CM | POA: Diagnosis not present

## 2022-10-16 DIAGNOSIS — J181 Lobar pneumonia, unspecified organism: Secondary | ICD-10-CM | POA: Diagnosis not present

## 2022-10-16 DIAGNOSIS — R051 Acute cough: Secondary | ICD-10-CM | POA: Diagnosis not present

## 2022-10-18 DIAGNOSIS — F4321 Adjustment disorder with depressed mood: Secondary | ICD-10-CM | POA: Diagnosis not present

## 2022-10-18 DIAGNOSIS — F5101 Primary insomnia: Secondary | ICD-10-CM | POA: Diagnosis not present

## 2022-10-24 DIAGNOSIS — E559 Vitamin D deficiency, unspecified: Secondary | ICD-10-CM | POA: Diagnosis not present

## 2022-10-24 DIAGNOSIS — M419 Scoliosis, unspecified: Secondary | ICD-10-CM | POA: Diagnosis not present

## 2022-10-24 DIAGNOSIS — J181 Lobar pneumonia, unspecified organism: Secondary | ICD-10-CM | POA: Diagnosis not present

## 2022-10-24 DIAGNOSIS — R051 Acute cough: Secondary | ICD-10-CM | POA: Diagnosis not present

## 2022-10-24 DIAGNOSIS — R2689 Other abnormalities of gait and mobility: Secondary | ICD-10-CM | POA: Diagnosis not present

## 2022-10-24 DIAGNOSIS — M130 Polyarthritis, unspecified: Secondary | ICD-10-CM | POA: Diagnosis not present

## 2022-10-29 DIAGNOSIS — F4321 Adjustment disorder with depressed mood: Secondary | ICD-10-CM | POA: Diagnosis not present

## 2022-10-29 DIAGNOSIS — I10 Essential (primary) hypertension: Secondary | ICD-10-CM | POA: Diagnosis not present

## 2022-11-21 DIAGNOSIS — M130 Polyarthritis, unspecified: Secondary | ICD-10-CM | POA: Diagnosis not present

## 2022-11-21 DIAGNOSIS — M419 Scoliosis, unspecified: Secondary | ICD-10-CM | POA: Diagnosis not present

## 2022-11-21 DIAGNOSIS — R2689 Other abnormalities of gait and mobility: Secondary | ICD-10-CM | POA: Diagnosis not present

## 2022-11-21 DIAGNOSIS — E559 Vitamin D deficiency, unspecified: Secondary | ICD-10-CM | POA: Diagnosis not present

## 2022-11-21 DIAGNOSIS — I10 Essential (primary) hypertension: Secondary | ICD-10-CM | POA: Diagnosis not present

## 2022-11-28 DIAGNOSIS — F5101 Primary insomnia: Secondary | ICD-10-CM | POA: Diagnosis not present

## 2022-11-28 DIAGNOSIS — I10 Essential (primary) hypertension: Secondary | ICD-10-CM | POA: Diagnosis not present

## 2022-11-28 DIAGNOSIS — M419 Scoliosis, unspecified: Secondary | ICD-10-CM | POA: Diagnosis not present

## 2022-12-05 DIAGNOSIS — H353132 Nonexudative age-related macular degeneration, bilateral, intermediate dry stage: Secondary | ICD-10-CM | POA: Diagnosis not present

## 2022-12-26 DIAGNOSIS — F4321 Adjustment disorder with depressed mood: Secondary | ICD-10-CM | POA: Diagnosis not present

## 2022-12-26 DIAGNOSIS — I10 Essential (primary) hypertension: Secondary | ICD-10-CM | POA: Diagnosis not present

## 2023-01-02 DIAGNOSIS — I10 Essential (primary) hypertension: Secondary | ICD-10-CM | POA: Diagnosis not present

## 2023-01-02 DIAGNOSIS — E559 Vitamin D deficiency, unspecified: Secondary | ICD-10-CM | POA: Diagnosis not present

## 2023-01-02 DIAGNOSIS — R2689 Other abnormalities of gait and mobility: Secondary | ICD-10-CM | POA: Diagnosis not present

## 2023-01-02 DIAGNOSIS — M130 Polyarthritis, unspecified: Secondary | ICD-10-CM | POA: Diagnosis not present

## 2023-01-02 DIAGNOSIS — M419 Scoliosis, unspecified: Secondary | ICD-10-CM | POA: Diagnosis not present

## 2023-01-16 DIAGNOSIS — E559 Vitamin D deficiency, unspecified: Secondary | ICD-10-CM | POA: Diagnosis not present

## 2023-01-16 DIAGNOSIS — I10 Essential (primary) hypertension: Secondary | ICD-10-CM | POA: Diagnosis not present

## 2023-01-16 DIAGNOSIS — M130 Polyarthritis, unspecified: Secondary | ICD-10-CM | POA: Diagnosis not present

## 2023-01-16 DIAGNOSIS — R2689 Other abnormalities of gait and mobility: Secondary | ICD-10-CM | POA: Diagnosis not present

## 2023-01-16 DIAGNOSIS — M419 Scoliosis, unspecified: Secondary | ICD-10-CM | POA: Diagnosis not present

## 2023-01-17 DIAGNOSIS — M65331 Trigger finger, right middle finger: Secondary | ICD-10-CM | POA: Diagnosis not present

## 2023-01-22 DIAGNOSIS — I10 Essential (primary) hypertension: Secondary | ICD-10-CM | POA: Diagnosis not present

## 2023-01-22 DIAGNOSIS — F4321 Adjustment disorder with depressed mood: Secondary | ICD-10-CM | POA: Diagnosis not present

## 2023-02-01 DIAGNOSIS — H02883 Meibomian gland dysfunction of right eye, unspecified eyelid: Secondary | ICD-10-CM | POA: Diagnosis not present

## 2023-02-01 DIAGNOSIS — H04201 Unspecified epiphora, right lacrimal gland: Secondary | ICD-10-CM | POA: Diagnosis not present

## 2023-02-13 DIAGNOSIS — M419 Scoliosis, unspecified: Secondary | ICD-10-CM | POA: Diagnosis not present

## 2023-02-13 DIAGNOSIS — H353221 Exudative age-related macular degeneration, left eye, with active choroidal neovascularization: Secondary | ICD-10-CM | POA: Diagnosis not present

## 2023-02-13 DIAGNOSIS — I10 Essential (primary) hypertension: Secondary | ICD-10-CM | POA: Diagnosis not present

## 2023-02-13 DIAGNOSIS — M130 Polyarthritis, unspecified: Secondary | ICD-10-CM | POA: Diagnosis not present

## 2023-02-13 DIAGNOSIS — E559 Vitamin D deficiency, unspecified: Secondary | ICD-10-CM | POA: Diagnosis not present

## 2023-02-13 DIAGNOSIS — R2689 Other abnormalities of gait and mobility: Secondary | ICD-10-CM | POA: Diagnosis not present

## 2023-02-20 DIAGNOSIS — E559 Vitamin D deficiency, unspecified: Secondary | ICD-10-CM | POA: Diagnosis not present

## 2023-02-20 DIAGNOSIS — I10 Essential (primary) hypertension: Secondary | ICD-10-CM | POA: Diagnosis not present

## 2023-03-20 DIAGNOSIS — M130 Polyarthritis, unspecified: Secondary | ICD-10-CM | POA: Diagnosis not present

## 2023-03-20 DIAGNOSIS — M419 Scoliosis, unspecified: Secondary | ICD-10-CM | POA: Diagnosis not present

## 2023-03-20 DIAGNOSIS — E559 Vitamin D deficiency, unspecified: Secondary | ICD-10-CM | POA: Diagnosis not present

## 2023-03-20 DIAGNOSIS — R2689 Other abnormalities of gait and mobility: Secondary | ICD-10-CM | POA: Diagnosis not present

## 2023-03-20 DIAGNOSIS — I10 Essential (primary) hypertension: Secondary | ICD-10-CM | POA: Diagnosis not present

## 2023-04-02 DIAGNOSIS — F4321 Adjustment disorder with depressed mood: Secondary | ICD-10-CM | POA: Diagnosis not present

## 2023-04-02 DIAGNOSIS — I10 Essential (primary) hypertension: Secondary | ICD-10-CM | POA: Diagnosis not present

## 2023-04-24 DIAGNOSIS — R600 Localized edema: Secondary | ICD-10-CM | POA: Diagnosis not present

## 2023-04-24 DIAGNOSIS — I10 Essential (primary) hypertension: Secondary | ICD-10-CM | POA: Diagnosis not present

## 2023-04-24 DIAGNOSIS — E559 Vitamin D deficiency, unspecified: Secondary | ICD-10-CM | POA: Diagnosis not present

## 2023-04-24 DIAGNOSIS — M419 Scoliosis, unspecified: Secondary | ICD-10-CM | POA: Diagnosis not present

## 2023-04-24 DIAGNOSIS — R2689 Other abnormalities of gait and mobility: Secondary | ICD-10-CM | POA: Diagnosis not present

## 2023-04-24 DIAGNOSIS — M130 Polyarthritis, unspecified: Secondary | ICD-10-CM | POA: Diagnosis not present

## 2023-04-27 ENCOUNTER — Emergency Department
Admission: EM | Admit: 2023-04-27 | Discharge: 2023-04-27 | Disposition: A | Discharge status: Home / Self Care | Payer: Medicare Other | Source: Home / Self Care | Attending: Emergency Medicine | Admitting: Emergency Medicine

## 2023-04-27 ENCOUNTER — Other Ambulatory Visit: Payer: Self-pay

## 2023-04-27 DIAGNOSIS — I10 Essential (primary) hypertension: Secondary | ICD-10-CM | POA: Insufficient documentation

## 2023-04-27 DIAGNOSIS — R0689 Other abnormalities of breathing: Secondary | ICD-10-CM | POA: Diagnosis not present

## 2023-04-27 DIAGNOSIS — R55 Syncope and collapse: Secondary | ICD-10-CM | POA: Insufficient documentation

## 2023-04-27 DIAGNOSIS — R531 Weakness: Secondary | ICD-10-CM | POA: Diagnosis not present

## 2023-04-27 LAB — BASIC METABOLIC PANEL
Anion gap: 5 (ref 5–15)
BUN: 21 mg/dL (ref 8–23)
CO2: 25 mmol/L (ref 22–32)
Calcium: 9 mg/dL (ref 8.9–10.3)
Chloride: 103 mmol/L (ref 98–111)
Creatinine, Ser: 0.77 mg/dL (ref 0.44–1.00)
GFR, Estimated: >60 mL/min (ref >60)
Glucose, Bld: 108 mg/dL — ABNORMAL HIGH (ref 70–99)
Potassium: 4.6 mmol/L (ref 3.5–5.1)
Sodium: 133 mmol/L — ABNORMAL LOW (ref 135–145)

## 2023-04-27 LAB — CBC
HCT: 37.7 % (ref 36.0–46.0)
Hemoglobin: 12.8 g/dL (ref 12.0–15.0)
MCH: 32.7 pg (ref 26.0–34.0)
MCHC: 34 g/dL (ref 30.0–36.0)
MCV: 96.4 fL (ref 80.0–100.0)
Platelets: 208 10*3/uL (ref 150–400)
RBC: 3.91 MIL/uL (ref 3.87–5.11)
RDW: 12.4 % (ref 11.5–15.5)
WBC: 4.6 10*3/uL (ref 4.0–10.5)
nRBC: 0 % (ref 0.0–0.2)

## 2023-04-27 LAB — URINALYSIS, ROUTINE W REFLEX MICROSCOPIC
Bilirubin Urine: NEGATIVE
Glucose, UA: NEGATIVE mg/dL
Hgb urine dipstick: NEGATIVE
Ketones, ur: NEGATIVE mg/dL
Leukocytes,Ua: NEGATIVE
Nitrite: NEGATIVE
Protein, ur: NEGATIVE mg/dL
Specific Gravity, Urine: 1.004 — ABNORMAL LOW (ref 1.005–1.030)
pH: 7 (ref 5.0–8.0)

## 2023-04-27 LAB — CBG MONITORING, ED: Glucose-Capillary: 92 mg/dL (ref 70–99)

## 2023-04-27 NOTE — ED Provider Triage Note (Signed)
Emergency Medicine Provider Triage Evaluation Note  Amy Shah , a 88 y.o. female  was evaluated in triage.  Pt complains of near syncopal episode, been taking diuretic for 2 weeks.  Review of Systems  Positive:  Negative:   Physical Exam  There were no vitals taken for this visit. Gen:   Awake, no distress   Resp:  Normal effort  MSK:   Moves extremities without difficulty  Other:    Medical Decision Making  Medically screening exam initiated at 4:55 PM.  Appropriate orders placed.  Yamileth Hayse Akhavan was informed that the remainder of the evaluation will be completed by another provider, this initial triage assessment does not replace that evaluation, and the importance of remaining in the ED until their evaluation is complete.     Faythe Ghee, PA-C 04/27/23 1656

## 2023-04-27 NOTE — ED Notes (Signed)
FIRST NURSE: Pt to ER via EMS from [facility] for generalized weakness and generally not feeling well.  VSS for EMS CBG 120

## 2023-04-27 NOTE — Discharge Instructions (Signed)
Your lab work and EKG did not have any concerning findings today.  Please follow-up with your primary care for any medication changes.  You can return to the ED with any new or worsening symptoms.

## 2023-04-27 NOTE — ED Provider Notes (Signed)
[facility] Provider Note    Event Date/Time   First MD Initiated Contact with Patient 04/27/23 1739     (approximate)   History   Near Syncope   HPI  Amy Shah is a 88 y.o. female with PMH of HTN, HLD who presents for evaluation of near syncope.  Patient states that when she was getting ready to eat lunch today she developed a strange feeling in her head and felt like she was going to pass out but did not lose consciousness.  Patient mentioned that this happened last week as well.  She was recently started on a diuretic and feels that this is the cause.      Physical Exam   Triage Vital Signs: ED Triage Vitals  Encounter Vitals Group     BP 04/27/23 1657 (!) 176/75     Systolic BP Percentile --      Diastolic BP Percentile --      Pulse Rate 04/27/23 1657 64     Resp 04/27/23 1657 16     Temp 04/27/23 1657 98.1 F (36.7 C)     Temp Source 04/27/23 1657 Oral     SpO2 04/27/23 1657 100 %     Weight 04/27/23 1658 145 lb (65.8 kg)     Height 04/27/23 1658 5\' 3"  (1.6 m)     Head Circumference --      Peak Flow --      Pain Score 04/27/23 1657 0     Pain Loc --      Pain Education --      Exclude from Growth Chart --     Most recent vital signs: Vitals:   04/27/23 1657 04/27/23 1759  BP: (!) 176/75 (!) 173/71  Pulse: 64 67  Resp: 16 16  Temp: 98.1 F (36.7 C)   SpO2: 100% 100%    General: Awake, no distress.  CV:  Good peripheral perfusion.  RRR. Resp:  Normal effort.  CTAB. Abd:  No distention.     ED Results / Procedures / Treatments   Labs (all labs ordered are listed, but only abnormal results are displayed) Labs Reviewed  BASIC METABOLIC PANEL - Abnormal; Notable for the following components:      Result Value   Sodium 133 (*)    Glucose, Bld 108 (*)    All other components within normal limits  URINALYSIS, ROUTINE W REFLEX MICROSCOPIC - Abnormal; Notable for the following components:   Color, Urine COLORLESS (*)     APPearance CLEAR (*)    Specific Gravity, Urine 1.004 (*)    All other components within normal limits  CBC  CBG MONITORING, ED     EKG  EKG as interpreted by me:  NSR VR 64 bpm PR interval 144 MS QRS duration 84 MS   PROCEDURES:  Critical Care performed: No  Procedures   MEDICATIONS ORDERED IN ED: Medications - No data to display   IMPRESSION / MDM / ASSESSMENT AND PLAN / ED COURSE  I reviewed the triage vital signs and the nursing notes.                             88 year old female presents for evaluation of near syncope.  VSS in triage aside from an elevated BP however patient does have a history of HTN.  Patient NAD on exam and states she is feeling much better than when she presented to  the ED.  Differential diagnosis includes, but is not limited to, medication side effect, cardiac arrhythmia, vasovagal reaction, anxiety, dehydration.  Patient's presentation is most consistent with acute, uncomplicated illness.  CBC and BMP unremarkable.  CBG WNL.  UA WNL.  EKG showed NSR.  Given patient's reassuring workup I feel she is stable for discharge.  I discussed with the patient that the diuretic may be a possible cause for her near syncope, however there are multiple other potential causes.  I explained that her workup was reassuring and did not reveal anything concerning.  I advised her to follow-up with her primary care regarding any medication changes.  Patient voiced understanding, was agreeable to plan and was stable at discharge.    FINAL CLINICAL IMPRESSION(S) / ED DIAGNOSES   Final diagnoses:  Near syncope     Rx / DC Orders   ED Discharge Orders     None        Note:  This document was prepared using Dragon voice recognition software and may include unintentional dictation errors.   Cameron Ali, PA-C 04/27/23 1804    Jene Every, MD 04/27/23 1840

## 2023-04-27 NOTE — ED Triage Notes (Signed)
Pt reports she was at her assisting living facility, pt reports she was getting ready to eat and almost fainted. Pt reports at present she feels better. Pt reports taking diuretics. Pt talks in complete sentences no respiratory distress noted

## 2023-04-30 ENCOUNTER — Telehealth: Payer: Self-pay | Admitting: *Deleted

## 2023-04-30 NOTE — Telephone Encounter (Signed)
Transition Care Management Unsuccessful Follow-up Telephone Call  Date of discharge and from where:  [facility] 04/27/2023  Attempts:  1st Attempt  Reason for unsuccessful TCM follow-up call:  No answer/busy

## 2023-05-01 ENCOUNTER — Telehealth: Payer: Self-pay | Admitting: *Deleted

## 2023-05-01 DIAGNOSIS — R55 Syncope and collapse: Secondary | ICD-10-CM | POA: Diagnosis not present

## 2023-05-01 DIAGNOSIS — R600 Localized edema: Secondary | ICD-10-CM | POA: Diagnosis not present

## 2023-05-01 DIAGNOSIS — I10 Essential (primary) hypertension: Secondary | ICD-10-CM | POA: Diagnosis not present

## 2023-05-01 NOTE — Telephone Encounter (Signed)
Transition Care Management Unsuccessful Follow-up Telephone Call  Date of discharge and from where:  [facility] 04/27/2023  Attempts:  2nd Attempt  Reason for unsuccessful TCM follow-up call:  No answer/busy

## 2023-05-06 DIAGNOSIS — J40 Bronchitis, not specified as acute or chronic: Secondary | ICD-10-CM | POA: Diagnosis not present

## 2023-05-06 DIAGNOSIS — I10 Essential (primary) hypertension: Secondary | ICD-10-CM | POA: Diagnosis not present

## 2023-05-08 DIAGNOSIS — H353221 Exudative age-related macular degeneration, left eye, with active choroidal neovascularization: Secondary | ICD-10-CM | POA: Diagnosis not present

## 2023-05-22 DIAGNOSIS — R600 Localized edema: Secondary | ICD-10-CM | POA: Diagnosis not present

## 2023-05-22 DIAGNOSIS — M419 Scoliosis, unspecified: Secondary | ICD-10-CM | POA: Diagnosis not present

## 2023-05-22 DIAGNOSIS — I10 Essential (primary) hypertension: Secondary | ICD-10-CM | POA: Diagnosis not present

## 2023-05-22 DIAGNOSIS — R2689 Other abnormalities of gait and mobility: Secondary | ICD-10-CM | POA: Diagnosis not present

## 2023-05-22 DIAGNOSIS — M130 Polyarthritis, unspecified: Secondary | ICD-10-CM | POA: Diagnosis not present

## 2023-05-22 DIAGNOSIS — E559 Vitamin D deficiency, unspecified: Secondary | ICD-10-CM | POA: Diagnosis not present

## 2023-06-07 DIAGNOSIS — J40 Bronchitis, not specified as acute or chronic: Secondary | ICD-10-CM | POA: Diagnosis not present

## 2023-06-07 DIAGNOSIS — I10 Essential (primary) hypertension: Secondary | ICD-10-CM | POA: Diagnosis not present

## 2023-06-18 DIAGNOSIS — E559 Vitamin D deficiency, unspecified: Secondary | ICD-10-CM | POA: Diagnosis not present

## 2023-06-18 DIAGNOSIS — M130 Polyarthritis, unspecified: Secondary | ICD-10-CM | POA: Diagnosis not present

## 2023-06-18 DIAGNOSIS — R2689 Other abnormalities of gait and mobility: Secondary | ICD-10-CM | POA: Diagnosis not present

## 2023-06-18 DIAGNOSIS — I10 Essential (primary) hypertension: Secondary | ICD-10-CM | POA: Diagnosis not present

## 2023-06-18 DIAGNOSIS — R600 Localized edema: Secondary | ICD-10-CM | POA: Diagnosis not present

## 2023-06-18 DIAGNOSIS — M419 Scoliosis, unspecified: Secondary | ICD-10-CM | POA: Diagnosis not present

## 2023-07-19 DIAGNOSIS — Z23 Encounter for immunization: Secondary | ICD-10-CM | POA: Diagnosis not present

## 2023-07-23 DIAGNOSIS — R600 Localized edema: Secondary | ICD-10-CM | POA: Diagnosis not present

## 2023-07-23 DIAGNOSIS — E559 Vitamin D deficiency, unspecified: Secondary | ICD-10-CM | POA: Diagnosis not present

## 2023-07-23 DIAGNOSIS — M419 Scoliosis, unspecified: Secondary | ICD-10-CM | POA: Diagnosis not present

## 2023-07-23 DIAGNOSIS — M130 Polyarthritis, unspecified: Secondary | ICD-10-CM | POA: Diagnosis not present

## 2023-07-23 DIAGNOSIS — R2689 Other abnormalities of gait and mobility: Secondary | ICD-10-CM | POA: Diagnosis not present

## 2023-07-23 DIAGNOSIS — I10 Essential (primary) hypertension: Secondary | ICD-10-CM | POA: Diagnosis not present

## 2023-07-31 DIAGNOSIS — H353221 Exudative age-related macular degeneration, left eye, with active choroidal neovascularization: Secondary | ICD-10-CM | POA: Diagnosis not present

## 2023-08-08 DIAGNOSIS — I10 Essential (primary) hypertension: Secondary | ICD-10-CM | POA: Diagnosis not present

## 2023-08-08 DIAGNOSIS — J40 Bronchitis, not specified as acute or chronic: Secondary | ICD-10-CM | POA: Diagnosis not present

## 2023-08-23 DIAGNOSIS — E559 Vitamin D deficiency, unspecified: Secondary | ICD-10-CM | POA: Diagnosis not present

## 2023-08-23 DIAGNOSIS — R600 Localized edema: Secondary | ICD-10-CM | POA: Diagnosis not present

## 2023-08-23 DIAGNOSIS — I1310 Hypertensive heart and chronic kidney disease without heart failure, with stage 1 through stage 4 chronic kidney disease, or unspecified chronic kidney disease: Secondary | ICD-10-CM | POA: Diagnosis not present

## 2023-08-23 DIAGNOSIS — M419 Scoliosis, unspecified: Secondary | ICD-10-CM | POA: Diagnosis not present

## 2023-08-23 DIAGNOSIS — M130 Polyarthritis, unspecified: Secondary | ICD-10-CM | POA: Diagnosis not present

## 2023-08-23 DIAGNOSIS — R2689 Other abnormalities of gait and mobility: Secondary | ICD-10-CM | POA: Diagnosis not present

## 2023-08-23 DIAGNOSIS — N182 Chronic kidney disease, stage 2 (mild): Secondary | ICD-10-CM | POA: Diagnosis not present

## 2023-09-02 DIAGNOSIS — J40 Bronchitis, not specified as acute or chronic: Secondary | ICD-10-CM | POA: Diagnosis not present

## 2023-09-02 DIAGNOSIS — I10 Essential (primary) hypertension: Secondary | ICD-10-CM | POA: Diagnosis not present

## 2023-09-27 DIAGNOSIS — M130 Polyarthritis, unspecified: Secondary | ICD-10-CM | POA: Diagnosis not present

## 2023-09-27 DIAGNOSIS — I1310 Hypertensive heart and chronic kidney disease without heart failure, with stage 1 through stage 4 chronic kidney disease, or unspecified chronic kidney disease: Secondary | ICD-10-CM | POA: Diagnosis not present

## 2023-09-27 DIAGNOSIS — E559 Vitamin D deficiency, unspecified: Secondary | ICD-10-CM | POA: Diagnosis not present

## 2023-09-27 DIAGNOSIS — N182 Chronic kidney disease, stage 2 (mild): Secondary | ICD-10-CM | POA: Diagnosis not present

## 2023-09-27 DIAGNOSIS — M419 Scoliosis, unspecified: Secondary | ICD-10-CM | POA: Diagnosis not present

## 2024-03-17 ENCOUNTER — Other Ambulatory Visit: Payer: Self-pay

## 2024-03-17 ENCOUNTER — Emergency Department
Admission: EM | Admit: 2024-03-17 | Discharge: 2024-03-17 | Disposition: A | Discharge status: Home / Self Care | Attending: Emergency Medicine | Admitting: Emergency Medicine

## 2024-03-17 ENCOUNTER — Emergency Department

## 2024-03-17 DIAGNOSIS — I10 Essential (primary) hypertension: Secondary | ICD-10-CM | POA: Diagnosis not present

## 2024-03-17 DIAGNOSIS — M546 Pain in thoracic spine: Secondary | ICD-10-CM | POA: Diagnosis not present

## 2024-03-17 DIAGNOSIS — I4891 Unspecified atrial fibrillation: Secondary | ICD-10-CM | POA: Insufficient documentation

## 2024-03-17 DIAGNOSIS — Z79899 Other long term (current) drug therapy: Secondary | ICD-10-CM | POA: Insufficient documentation

## 2024-03-17 DIAGNOSIS — M549 Dorsalgia, unspecified: Secondary | ICD-10-CM

## 2024-03-17 DIAGNOSIS — R7401 Elevation of levels of liver transaminase levels: Secondary | ICD-10-CM | POA: Insufficient documentation

## 2024-03-17 DIAGNOSIS — Z96642 Presence of left artificial hip joint: Secondary | ICD-10-CM | POA: Insufficient documentation

## 2024-03-17 LAB — CBC WITH DIFFERENTIAL/PLATELET
Abs Immature Granulocytes: 0.01 10*3/uL (ref 0.00–0.07)
Basophils Absolute: 0 10*3/uL (ref 0.0–0.1)
Basophils Relative: 1 %
Eosinophils Absolute: 0.1 10*3/uL (ref 0.0–0.5)
Eosinophils Relative: 2 %
HCT: 39 % (ref 36.0–46.0)
Hemoglobin: 13 g/dL (ref 12.0–15.0)
Immature Granulocytes: 0 %
Lymphocytes Relative: 31 %
Lymphs Abs: 1.2 10*3/uL (ref 0.7–4.0)
MCH: 32.7 pg (ref 26.0–34.0)
MCHC: 33.3 g/dL (ref 30.0–36.0)
MCV: 98.2 fL (ref 80.0–100.0)
Monocytes Absolute: 0.3 10*3/uL (ref 0.1–1.0)
Monocytes Relative: 8 %
Neutro Abs: 2.3 10*3/uL (ref 1.7–7.7)
Neutrophils Relative %: 58 %
Platelets: 182 10*3/uL (ref 150–400)
RBC: 3.97 MIL/uL (ref 3.87–5.11)
RDW: 12.8 % (ref 11.5–15.5)
WBC: 4 10*3/uL (ref 4.0–10.5)
nRBC: 0 % (ref 0.0–0.2)

## 2024-03-17 LAB — COMPREHENSIVE METABOLIC PANEL WITH GFR
ALT: 102 U/L — ABNORMAL HIGH (ref 0–44)
AST: 111 U/L — ABNORMAL HIGH (ref 15–41)
Albumin: 4.2 g/dL (ref 3.5–5.0)
Alkaline Phosphatase: 83 U/L (ref 38–126)
Anion gap: 10 (ref 5–15)
BUN: 21 mg/dL (ref 8–23)
CO2: 23 mmol/L (ref 22–32)
Calcium: 9.5 mg/dL (ref 8.9–10.3)
Chloride: 103 mmol/L (ref 98–111)
Creatinine, Ser: 0.74 mg/dL (ref 0.44–1.00)
GFR, Estimated: >60 mL/min (ref >60)
Glucose, Bld: 103 mg/dL — ABNORMAL HIGH (ref 70–99)
Potassium: 4.5 mmol/L (ref 3.5–5.1)
Sodium: 136 mmol/L (ref 135–145)
Total Bilirubin: 0.8 mg/dL (ref 0.0–1.2)
Total Protein: 6.8 g/dL (ref 6.5–8.1)

## 2024-03-17 LAB — URINALYSIS, W/ REFLEX TO CULTURE (INFECTION SUSPECTED)
Bacteria, UA: NONE SEEN
Bilirubin Urine: NEGATIVE
Glucose, UA: NEGATIVE mg/dL
Hgb urine dipstick: NEGATIVE
Ketones, ur: NEGATIVE mg/dL
Leukocytes,Ua: NEGATIVE
Nitrite: NEGATIVE
Protein, ur: NEGATIVE mg/dL
RBC / HPF: 0 RBC/hpf (ref 0–5)
Specific Gravity, Urine: 1.004 — ABNORMAL LOW (ref 1.005–1.030)
Squamous Epithelial / HPF: 0 /HPF (ref 0–5)
pH: 7 (ref 5.0–8.0)

## 2024-03-17 LAB — MAGNESIUM: Magnesium: 2.4 mg/dL (ref 1.7–2.4)

## 2024-03-17 LAB — T4, FREE: Free T4: 1 ng/dL (ref 0.61–1.12)

## 2024-03-17 LAB — TROPONIN I (HIGH SENSITIVITY)
Troponin I (High Sensitivity): 10 ng/L (ref <18)
Troponin I (High Sensitivity): 10 ng/L (ref <18)

## 2024-03-17 LAB — TSH: TSH: 4.872 u[IU]/mL — ABNORMAL HIGH (ref 0.350–4.500)

## 2024-03-17 LAB — LIPASE, BLOOD: Lipase: 43 U/L (ref 11–51)

## 2024-03-17 MED ORDER — DILTIAZEM HCL 25 MG/5ML IV SOLN
10.0000 mg | Freq: Once | INTRAVENOUS | Status: AC
  Administered 2024-03-17: 10 mg via INTRAVENOUS
  Filled 2024-03-17: qty 5

## 2024-03-17 MED ORDER — IOHEXOL 350 MG/ML SOLN
75.0000 mL | Freq: Once | INTRAVENOUS | Status: AC | PRN
  Administered 2024-03-17: 75 mL via INTRAVENOUS

## 2024-03-17 MED ORDER — RIVAROXABAN 20 MG PO TABS: 20.0000 mg | ORAL_TABLET | Freq: Every day | ORAL | 0 refills | Status: AC

## 2024-03-17 NOTE — ED Notes (Signed)
 Pt ambulatory to toilet in room. Requesting to sit on edge of bed.

## 2024-03-17 NOTE — Discharge Instructions (Signed)
 Your AST and ALT were slightly elevated today.  I recommend you avoid Tylenol, alcohol and follow-up with your primary care doctor to have this rechecked.  Your cardiac labs were normal.  CT of your chest showed no blood clot but did show small amount of fluid at the bottom of both of your lungs called pleural effusions.  Low suspicion that this is what is causing any of your pain today.

## 2024-03-17 NOTE — ED Provider Notes (Signed)
 [facility] Provider Note    Event Date/Time   First MD Initiated Contact with Patient 03/17/24 [phone]     (approximate)   History   Weakness   HPI  BREELLE HOLLYWOOD is a 89 y.o. female with history of atrial fibrillation, hypertension, right middle lobe syndrome, hyperlipidemia who presents to the emergency department with burning in the upper back.  She denies any chest pain, shortness of breath, nausea, vomiting, diarrhea, fever or cough.  No aggravating or alleviating factors.  Denies any known injury.  Does report that the upper abdomen felt sore earlier.  She declines anything for pain.   History provided by patient.    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 Authorizing Provider  acetaminophen (TYLENOL) 500 MG tablet Take 1,000 mg by mouth 2 (two) times daily.    [provider]  albuterol  (PROAIR  HFA) 108 (90 Base) MCG/ACT inhaler Inhale 1 puff into the lungs every 6 (six) hours as needed. 04/20/22   Clarise Crooks, MD  amoxicillin (AMOXIL) 500 MG capsule Take 500 mg by mouth. 4 caps 1 hr before dental procedure    [provider]  calcium carbonate (TUMS - DOSED IN MG ELEMENTAL CALCIUM) 500 MG chewable tablet Chew 1 tablet by mouth as needed for indigestion or heartburn. For reflux    [provider]  carboxymethylcellulose (REFRESH PLUS) 0.5 % SOLN 1 drop daily as needed.    [provider]  Cholecalciferol (VITAMIN D-3) 25 MCG (1000 UT) CAPS Take 1 capsule by mouth daily.    [provider]  Ibuprofen 200 MG CAPS Take by mouth.    [provider]  meclizine  (ANTIVERT ) 12.5 MG tablet Take 12.5 mg by mouth as needed for dizziness. dizziness    [provider]  Multiple Vitamins-Minerals (PRESERVISION AREDS PO) Take 2 tablets by mouth daily.    [provider]  traZODone  (DESYREL ) 50 MG tablet Take 0.5-1 tablets (25-50 mg total) by mouth at bedtime as needed for sleep. 04/24/22   Clarise Crooks, MD    Physical Exam   Triage Vital Signs: ED Triage Vitals  Encounter Vitals Group     BP 03/17/24 0058 (!) 168/97     Systolic BP Percentile --      Diastolic BP Percentile --      Pulse Rate 03/17/24 0058 (!) 101     Resp 03/17/24 0058 20     Temp 03/17/24 0058 98.3 F (36.8 C)     Temp Source 03/17/24 0058 Oral     SpO2 03/17/24 0058 99 %     Weight 03/17/24 0051 155 lb (70.3 kg)     Height 03/17/24 0051 5\' 3"  (1.6 m)     Head Circumference --      Peak Flow --  Pain Score 03/17/24 0051 0     Pain Loc --      Pain Education --      Exclude from Growth Chart --      Most recent vital signs: Vitals:   03/17/24 0230 03/17/24 0549  BP:  (!) 147/64  Pulse: 72 98  Resp: 20 18  Temp:  98.3 F (36.8 C)  SpO2: 98% 100%    CONSTITUTIONAL: Alert, responds appropriately to questions. Well-appearing; well-nourished HEAD: Normocephalic, atraumatic EYES: Conjunctivae clear, pupils appear equal, sclera nonicteric ENT: normal nose; moist mucous membranes NECK: Supple, normal ROM CARD: Irregular and slightly tachycardic; S1 and S2 appreciated RESP: Normal chest excursion without splinting or tachypnea; breath sounds clear and equal bilaterally; no wheezes, no rhonchi, no rales, no hypoxia or respiratory distress, speaking full sentences ABD/GI: Non-distended; soft, non-tender, no rebound, no guarding, no peritoneal  signs BACK: The back appears normal, no midline spinal tenderness or step-off or deformity.  No significant tenderness over the paraspinal muscles. EXT: Normal ROM in all joints; no deformity noted, no edema, no calf tenderness or calf swelling SKIN: Normal color for age and race; warm; no rash on exposed skin NEURO: Moves all extremities equally, normal speech PSYCH: The patient's mood and manner are appropriate.   ED Results / Procedures / Treatments   LABS: (all labs ordered are listed, but only abnormal results are displayed) Labs Reviewed  COMPREHENSIVE METABOLIC PANEL WITH GFR - Abnormal; Notable for the following components:      Result Value   Glucose, Bld 103 (*)    AST 111 (*)    ALT 102 (*)    All other components within normal limits  URINALYSIS, W/ REFLEX TO CULTURE (INFECTION SUSPECTED) - Abnormal; Notable for the following components:   Color, Urine COLORLESS (*)    APPearance CLEAR (*)    Specific Gravity, Urine 1.004 (*)    All other components within normal limits  TSH - Abnormal; Notable for the following components:   TSH 4.872 (*)    All other components within normal limits  CBC WITH DIFFERENTIAL/PLATELET  LIPASE, BLOOD  MAGNESIUM  T4, FREE  TROPONIN I (HIGH SENSITIVITY)  TROPONIN I (HIGH SENSITIVITY)     EKG:  EKG Interpretation Date/Time:  Tuesday March 17 2024 00:55:04 EDT Ventricular Rate:  98 PR Interval:    QRS Duration:  95 QT Interval:  358 QTC Calculation: 471 R Axis:   7  Text Interpretation: Atrial fibrillation Probable anterior infarct, age indeterminate Confirmed by Verneda Golder [phone]) on 03/17/2024 12:56:06 AM         RADIOLOGY: My personal review and interpretation of imaging: CTA chest shows bilateral pleural effusions.  Right upper quadrant ultrasound unremarkable.  I have personally reviewed all radiology reports.   US  ABDOMEN LIMITED RUQ (LIVER/GB) Result Date: 03/17/2024 CLINICAL DATA:  Elevated LFTs EXAM: ULTRASOUND  ABDOMEN LIMITED RIGHT UPPER QUADRANT COMPARISON:  None Available. FINDINGS: Gallbladder: No gallstones or wall thickening visualized. No sonographic Murphy sign noted by sonographer. Common bile duct: Diameter: 3.5 mm Liver: No focal lesion identified. Within normal limits in parenchymal echogenicity. Portal vein is patent on color Doppler imaging with normal direction of blood flow towards the liver. Other: Small right-sided pleural effusion is noted. IMPRESSION: No acute abnormality in right upper quadrant. Small right pleural effusion is noted. Electronically Signed   By: Violeta Grey M.D.   On: 03/17/2024 02:57   CT Angio Chest PE W and/or Wo Contrast Result Date: 03/17/2024 CLINICAL DATA:  Weakness EXAM: CT ANGIOGRAPHY CHEST WITH CONTRAST TECHNIQUE: Multidetector CT imaging of the chest was performed using the standard protocol during bolus administration of intravenous contrast. Multiplanar CT image reconstructions and MIPs were obtained to evaluate the vascular anatomy. RADIATION DOSE REDUCTION: This exam was performed according to the departmental dose-optimization program which includes automated exposure control, adjustment of the mA and/or kV according to patient size and/or use of iterative reconstruction technique. CONTRAST:  75mL OMNIPAQUE IOHEXOL 350 MG/ML SOLN COMPARISON:  Plain film from earlier in the same day. FINDINGS: Cardiovascular: Atherosclerotic calcifications of the thoracic aorta are noted. Pulmonary artery shows a normal branching pattern bilaterally. No intraluminal filling defect to suggest pulmonary embolism is noted. Coronary calcifications are noted. Mild cardiac enlargement is noted. Mediastinum/Nodes: Thoracic inlet is within normal limits. No hilar or mediastinal adenopathy is noted. The esophagus as visualized is within normal limits. Lungs/Pleura: Small bilateral pleural effusions are noted with mild basilar atelectatic changes. No sizable parenchymal nodule is seen. Linear  density is noted in the medial aspect of the right middle lobe anteriorly consistent with atelectatic change. Upper Abdomen: No acute abnormality. Musculoskeletal: No chest wall abnormality. No acute or significant osseous findings. Review of the MIP images confirms the above findings. IMPRESSION: No evidence of pulmonary emboli. Mild right middle lobe atelectatic changes. Small bilateral pleural effusions. Aortic Atherosclerosis (ICD10-I70.0). Electronically Signed   By: Violeta Grey M.D.   On: 03/17/2024 01:55   DG Chest Portable 1 View Result Date: 03/17/2024 CLINICAL DATA:  Weakness EXAM: PORTABLE CHEST 1 VIEW COMPARISON:  04/13/2020 FINDINGS: Cardiac shadow is enlarged. Stable scoliosis is noted. The lungs are well aerated bilaterally. No focal infiltrate or effusion is seen. No bony noted. IMPRESSION: No active disease. Electronically Signed   By: Violeta Grey M.D.   On: 03/17/2024 01:17     PROCEDURES:  Critical Care performed: No     .1-3 Lead EKG Interpretation  Performed by: Isa Hitz, Clover Dao, DO Authorized by: Stefon Ramthun, Clover Dao, DO     Interpretation: normal     ECG rate:  98   ECG rate assessment: normal     Rhythm: atrial fibrillation     Ectopy: none     Conduction: normal       IMPRESSION / MDM / ASSESSMENT AND PLAN / ED COURSE  I reviewed the triage vital signs and the nursing notes.    Patient here with upper back burning.  The patient is on the cardiac monitor to evaluate for evidence of arrhythmia and/or significant heart rate changes.   DIFFERENTIAL DIAGNOSIS (includes but not limited to):   ACS, arrhythmia, PE, esophagitis, GERD, musculoskeletal pain, doubt dissection   Patient's presentation is most consistent with acute presentation with potential threat to life or bodily function.   PLAN: EKG shows atrial fibrillation.  This is a relatively new diagnosis for her.  She has been started on metoprolol but not on anticoagulation.  CHA2DS2-VASc 2 score is 4.   I do think she needs to be started on an anticoagulant.  I think she also needs to be referred to the A-fib clinic.  Will give dose of diltiazem here as she does have some mild tachycardia.  Will obtain cardiac labs, CTA of the chest to evaluate for PE.  Will also obtain abdominal labs, urine.  Pain may be musculoskeletal in nature.  She denies anything for pain at this time.   MEDICATIONS GIVEN IN ED: Medications  diltiazem (CARDIZEM) injection 10 mg (10 mg Intravenous Given 03/17/24  2130)  iohexol (OMNIPAQUE) 350 MG/ML injection 75 mL (75 mLs Intravenous Contrast Given 03/17/24 0144)     ED COURSE: Troponin x 2 negative.  Normal hemoglobin, electrolytes.  Minimally elevated AST and ALT but normal total bilirubin.  I did obtain a right upper quadrant abdominal ultrasound which was unremarkable when reviewed/interpreted by myself and the radiologist.  Have advised her to avoid Tylenol.  She does not drink alcohol.  She can have this followed by her primary care provider.  As for her new onset A-fib, electrolytes were normal today.  TSH elevated but free T4 normal.  Rate has been controlled here.  CT scan reviewed and interpreted by myself and the radiologist and shows no PE.  She does have small bilateral pleural effusions but I have low suspicion that this is the cause of any of her discomfort.  She does have mild right middle lobe atelectatic changes and states she was diagnosed with "right middle lobe syndrome" years ago.  No hypoxia or shortness of breath.  I do feel she is safe for discharge home and can follow-up with her outpatient doctors.  She lives at [facility] assisted living.  Will place referral to A-fib clinic and send in a prescription for Xarelto.  Patient comfortable with this plan.  At this time, I do not feel there is any life-threatening condition present. I reviewed all nursing notes, vitals, pertinent previous records.  All lab and urine results, EKGs, imaging ordered have  been independently reviewed and interpreted by myself.  I reviewed all available radiology reports from any imaging ordered this visit.  Based on my assessment, I feel the patient is safe to be discharged home without further emergent workup and can continue workup as an outpatient as needed. Discussed all findings, treatment plan as well as usual and customary return precautions.  They verbalize understanding and are comfortable with this plan.  Outpatient follow-up has been provided as needed.  All questions have been answered.    CONSULTS: Admission considered but workup reassuring, patient comfortable plan for outpatient follow-up.   OUTSIDE RECORDS REVIEWED: Reviewed last admission in 2016.       FINAL CLINICAL IMPRESSION(S) / ED DIAGNOSES   Final diagnoses:  Upper back pain  Transaminitis  New onset atrial fibrillation [facility])     Rx / DC Orders   ED Discharge Orders          Ordered    Amb Referral to AFIB Clinic        03/17/24 0627    rivaroxaban (XARELTO) 20 MG TABS tablet  Daily with supper        03/17/24 8657             Note:  This document was prepared using Dragon voice recognition software and may include unintentional dictation errors.   Gwenette Wellons, Clover Dao, DO 03/17/24 [phone]

## 2024-03-17 NOTE — ED Triage Notes (Signed)
 BIB [facility] from [facility] with CC of weakness this am. Recent dx of afib - this week. Reports episode of "burning" of L arm and shoulder blade that woke pt from sleep - can still feel burning but states it is much less. A&Ox4.

## 2024-03-17 NOTE — ED Notes (Signed)
 CCMD called to initiate cardiac monitoring

## 2024-04-03 ENCOUNTER — Emergency Department

## 2024-04-03 ENCOUNTER — Emergency Department
Admission: EM | Admit: 2024-04-03 | Discharge: 2024-04-03 | Disposition: A | Discharge status: Home / Self Care | Source: Home / Self Care | Attending: Emergency Medicine | Admitting: Emergency Medicine

## 2024-04-03 DIAGNOSIS — R002 Palpitations: Secondary | ICD-10-CM

## 2024-04-03 DIAGNOSIS — I4891 Unspecified atrial fibrillation: Secondary | ICD-10-CM | POA: Insufficient documentation

## 2024-04-03 LAB — CBC
HCT: 37.1 % (ref 36.0–46.0)
Hemoglobin: 12.3 g/dL (ref 12.0–15.0)
MCH: 32.5 pg (ref 26.0–34.0)
MCHC: 33.2 g/dL (ref 30.0–36.0)
MCV: 98.1 fL (ref 80.0–100.0)
Platelets: 189 10*3/uL (ref 150–400)
RBC: 3.78 MIL/uL — ABNORMAL LOW (ref 3.87–5.11)
RDW: 12.4 % (ref 11.5–15.5)
WBC: 6.7 10*3/uL (ref 4.0–10.5)
nRBC: 0 % (ref 0.0–0.2)

## 2024-04-03 LAB — COMPREHENSIVE METABOLIC PANEL WITH GFR
ALT: 27 U/L (ref 0–44)
AST: 35 U/L (ref 15–41)
Albumin: 3.7 g/dL (ref 3.5–5.0)
Alkaline Phosphatase: 84 U/L (ref 38–126)
Anion gap: 9 (ref 5–15)
BUN: 15 mg/dL (ref 8–23)
CO2: 22 mmol/L (ref 22–32)
Calcium: 8.5 mg/dL — ABNORMAL LOW (ref 8.9–10.3)
Chloride: 104 mmol/L (ref 98–111)
Creatinine, Ser: 0.63 mg/dL (ref 0.44–1.00)
GFR, Estimated: >60 mL/min (ref >60)
Glucose, Bld: 111 mg/dL — ABNORMAL HIGH (ref 70–99)
Potassium: 4.1 mmol/L (ref 3.5–5.1)
Sodium: 135 mmol/L (ref 135–145)
Total Bilirubin: 0.7 mg/dL (ref 0.0–1.2)
Total Protein: 6.2 g/dL — ABNORMAL LOW (ref 6.5–8.1)

## 2024-04-03 LAB — TROPONIN I (HIGH SENSITIVITY): Troponin I (High Sensitivity): 10 ng/L (ref <18)

## 2024-04-03 LAB — MAGNESIUM: Magnesium: 2.1 mg/dL (ref 1.7–2.4)

## 2024-04-03 MED ORDER — METOPROLOL SUCCINATE ER 50 MG PO TB24: 50.0000 mg | ORAL_TABLET | Freq: Every day | ORAL | 2 refills | Status: DC

## 2024-04-03 MED ORDER — METOPROLOL SUCCINATE ER 50 MG PO TB24
50.0000 mg | ORAL_TABLET | Freq: Once | ORAL | Status: AC
  Administered 2024-04-03: 50 mg via ORAL
  Filled 2024-04-03: qty 1

## 2024-04-03 MED ORDER — METOPROLOL TARTRATE 5 MG/5ML IV SOLN
5.0000 mg | Freq: Once | INTRAVENOUS | Status: AC
  Administered 2024-04-03: 5 mg via INTRAVENOUS
  Filled 2024-04-03: qty 5

## 2024-04-03 NOTE — ED Notes (Signed)
 Patient to x-ray via stretcher and rad staff.

## 2024-04-03 NOTE — ED Notes (Signed)
 ED Provider at bedside.

## 2024-04-03 NOTE — ED Provider Notes (Signed)
-----------------------------------------   3:34 PM on 04/03/2024 -----------------------------------------  Blood pressure 132/76, pulse (!) 115, temperature 98.4 F (36.9 C), temperature source Oral, resp. rate 17, height 5' 3 (1.6 m), weight 71.2 kg, SpO2 99%.  Assuming care from Dr. Suzanne.  In short, Amy Shah is a 89 y.o. female with a chief complaint of Palpitations .  Refer to the original H&P for additional details.  The current plan of care is to reassess HR following dose of IV metoprolol.  ----------------------------------------- 4:35 PM on 04/03/2024 ----------------------------------------- Heart rate improving following IV metoprolol, now consistently in the 80s to 90s.  Patient continues to feel well with no chest pain or shortness of breath, appropriate for outpatient management with cardiology follow-up.  We will prescribe metoprolol to go with previously prescribed Xarelto , she was counseled to return to the ED for new or worsening symptoms.  Patient and family agree with plan.       Willo Dunnings, MD 04/03/24 [phone]

## 2024-04-03 NOTE — ED Triage Notes (Signed)
 Pt BIB EMS for a-fib. Pt recently dx with a-fib (3 weeks) and started on metoprolol and patient currently in afib rate 100-150. Patient complaining of palpitations.

## 2024-04-03 NOTE — ED Notes (Signed)
 Patient daughter at the bedside. Pt back from radiology. No distress. HR 100-130's. Patient denies symptoms at this time. Will continue to monitor.

## 2024-04-03 NOTE — ED Provider Notes (Signed)
 [facility] Provider Note    Event Date/Time   First MD Initiated Contact with Patient 04/03/24 1350     (approximate)   History   Palpitations   HPI  Amy Shah is a 89 y.o. female past medical history significant for recently diagnosed atrial fibrillation who presents to the emergency department with heart palpitations.  States that she has been having intermittent episodes of heart palpitations since she was diagnosed with atrial fibrillation.  States that she has been having an ongoing cough as well.  States that she has been compliant with her Xarelto .  Also been compliant with her metoprolol and takes 50 mg once a day.  Also takes lisinopril.  No chest pain or short of breath     Physical Exam   Triage Vital Signs: ED Triage Vitals  Encounter Vitals Group     BP 04/03/24 1350 (!) 151/95     Girls Systolic BP Percentile --      Girls Diastolic BP Percentile --      Boys Systolic BP Percentile --      Boys Diastolic BP Percentile --      Pulse Rate 04/03/24 1350 (!) 106     Resp 04/03/24 1350 (!) 22     Temp 04/03/24 1350 98.4 F (36.9 C)     Temp Source 04/03/24 1350 Oral     SpO2 04/03/24 1347 98 %     Weight 04/03/24 1348 157 lb (71.2 kg)     Height 04/03/24 1348 5' 3 (1.6 m)     Head Circumference --      Peak Flow --      Pain Score 04/03/24 1348 0     Pain Loc --      Pain Education --      Exclude from Growth Chart --     Most recent vital signs: Vitals:   04/03/24 1350 04/03/24 1500  BP: (!) 151/95 132/76  Pulse: (!) 106 (!) 115  Resp: (!) 22 17  Temp: 98.4 F (36.9 C)   SpO2: 100% 99%    Physical Exam Constitutional:      Appearance: She is well-developed.  HENT:     Head: Atraumatic.   Eyes:     Conjunctiva/sclera: Conjunctivae normal.    Cardiovascular:     Rate and Rhythm: Tachycardia present. Rhythm irregular.     Comments: Heart rate of 100-1 20s Pulmonary:     Effort: No respiratory distress.   Abdominal:     General: There is no distension.   Musculoskeletal:        General: Normal range of motion.     Cervical back: Normal range of motion.     Right lower leg: No edema.     Left lower leg: No edema.   Skin:    General: Skin is warm.   Neurological:     Mental Status: She is alert. Mental status is at baseline.     IMPRESSION / MDM / ASSESSMENT AND PLAN / ED COURSE  I reviewed the triage vital signs and the nursing notes.  Differential diagnosis including atrial fibrillation with a rapid rate that is not rate controlled, electrolyte abnormality, dehydration, ACS, anemia  EKG  I, Clotilda Punter, the attending physician, personally viewed and interpreted this ECG.  Atrial fibrillation with a rapid rate of 105.  No significant ST elevation or depression.  No signs of acute ischemia or dysrhythmia Atrial fibrillation while on cardiac telemetry.  RADIOLOGY  I independently reviewed imaging, my interpretation of imaging: Chest x-ray with some cardiomegaly but no significant findings of heart failure or pneumonia  LABS (all labs ordered are listed, but only abnormal results are displayed) Labs interpreted as -    Labs Reviewed  CBC - Abnormal; Notable for the following components:      Result Value   RBC 3.78 (*)    All other components within normal limits  COMPREHENSIVE METABOLIC PANEL WITH GFR - Abnormal; Notable for the following components:   Glucose, Bld 111 (*)    Calcium 8.5 (*)    Total Protein 6.2 (*)    All other components within normal limits  MAGNESIUM  TROPONIN I (HIGH SENSITIVITY)     MDM  Lab work overall unremarkable with no significant electrolyte abnormality or anemia.  Given oral metoprolol.  Continues to have some mild tachycardia with heart rates up to 120 will give an IV dose.  If able to rate control will discharge home with referral for cardiology and outpatient follow-up.  Otherwise will need admission for rate control.      PROCEDURES:  Critical Care performed: No  Procedures  Patient's presentation is most consistent with acute presentation with potential threat to life or bodily function.   MEDICATIONS ORDERED IN ED: Medications  metoprolol tartrate (LOPRESSOR) injection 5 mg (has no administration in time range)  metoprolol succinate (TOPROL-XL) 24 hr tablet 50 mg (50 mg Oral Given 04/03/24 1430)    FINAL CLINICAL IMPRESSION(S) / ED DIAGNOSES   Final diagnoses:  Palpitations  Atrial fibrillation with RVR (HCC)     Rx / DC Orders   ED Discharge Orders     None        Note:  This document was prepared using Dragon voice recognition software and may include unintentional dictation errors.   Suzanne Kirsch, MD 04/03/24 1538

## 2024-04-05 ENCOUNTER — Other Ambulatory Visit: Payer: Self-pay

## 2024-04-05 ENCOUNTER — Inpatient Hospital Stay
Admission: EM | Admit: 2024-04-05 | Discharge: 2024-04-11 | DRG: 308 | Disposition: A | Discharge status: Nursing Home (Includes ICF) | Source: Skilled Nursing Facility | Attending: Internal Medicine | Admitting: Internal Medicine

## 2024-04-05 ENCOUNTER — Emergency Department

## 2024-04-05 DIAGNOSIS — Z7901 Long term (current) use of anticoagulants: Secondary | ICD-10-CM

## 2024-04-05 DIAGNOSIS — N814 Uterovaginal prolapse, unspecified: Secondary | ICD-10-CM | POA: Diagnosis present

## 2024-04-05 DIAGNOSIS — Z823 Family history of stroke: Secondary | ICD-10-CM

## 2024-04-05 DIAGNOSIS — I4891 Unspecified atrial fibrillation: Secondary | ICD-10-CM | POA: Diagnosis not present

## 2024-04-05 DIAGNOSIS — I110 Hypertensive heart disease with heart failure: Secondary | ICD-10-CM | POA: Diagnosis not present

## 2024-04-05 DIAGNOSIS — I5031 Acute diastolic (congestive) heart failure: Secondary | ICD-10-CM | POA: Diagnosis not present

## 2024-04-05 DIAGNOSIS — Z90710 Acquired absence of both cervix and uterus: Secondary | ICD-10-CM

## 2024-04-05 DIAGNOSIS — E663 Overweight: Secondary | ICD-10-CM | POA: Diagnosis present

## 2024-04-05 DIAGNOSIS — E785 Hyperlipidemia, unspecified: Secondary | ICD-10-CM | POA: Diagnosis present

## 2024-04-05 DIAGNOSIS — Z96642 Presence of left artificial hip joint: Secondary | ICD-10-CM | POA: Diagnosis present

## 2024-04-05 DIAGNOSIS — R350 Frequency of micturition: Secondary | ICD-10-CM | POA: Diagnosis present

## 2024-04-05 DIAGNOSIS — J209 Acute bronchitis, unspecified: Secondary | ICD-10-CM | POA: Diagnosis present

## 2024-04-05 DIAGNOSIS — Z66 Do not resuscitate: Secondary | ICD-10-CM | POA: Diagnosis present

## 2024-04-05 DIAGNOSIS — R059 Cough, unspecified: Secondary | ICD-10-CM | POA: Diagnosis present

## 2024-04-05 DIAGNOSIS — I10 Essential (primary) hypertension: Secondary | ICD-10-CM | POA: Diagnosis present

## 2024-04-05 DIAGNOSIS — B349 Viral infection, unspecified: Secondary | ICD-10-CM | POA: Diagnosis present

## 2024-04-05 DIAGNOSIS — Z79899 Other long term (current) drug therapy: Secondary | ICD-10-CM

## 2024-04-05 DIAGNOSIS — Z6827 Body mass index (BMI) 27.0-27.9, adult: Secondary | ICD-10-CM

## 2024-04-05 HISTORY — DX: Unspecified atrial fibrillation: I48.91

## 2024-04-05 LAB — LACTIC ACID, PLASMA: Lactic Acid, Venous: 1.2 mmol/L (ref 0.5–1.9)

## 2024-04-05 LAB — COMPREHENSIVE METABOLIC PANEL WITH GFR
ALT: 33 U/L (ref 0–44)
AST: 35 U/L (ref 15–41)
Albumin: 3.9 g/dL (ref 3.5–5.0)
Alkaline Phosphatase: 97 U/L (ref 38–126)
Anion gap: 11 (ref 5–15)
BUN: 14 mg/dL (ref 8–23)
CO2: 23 mmol/L (ref 22–32)
Calcium: 8.8 mg/dL — ABNORMAL LOW (ref 8.9–10.3)
Chloride: 102 mmol/L (ref 98–111)
Creatinine, Ser: 0.67 mg/dL (ref 0.44–1.00)
GFR, Estimated: >60 mL/min (ref >60)
Glucose, Bld: 123 mg/dL — ABNORMAL HIGH (ref 70–99)
Potassium: 3.8 mmol/L (ref 3.5–5.1)
Sodium: 136 mmol/L (ref 135–145)
Total Bilirubin: 1.3 mg/dL — ABNORMAL HIGH (ref 0.0–1.2)
Total Protein: 6.7 g/dL (ref 6.5–8.1)

## 2024-04-05 LAB — URINALYSIS, W/ REFLEX TO CULTURE (INFECTION SUSPECTED)
Bacteria, UA: NONE SEEN
Bilirubin Urine: NEGATIVE
Glucose, UA: NEGATIVE mg/dL
Hgb urine dipstick: NEGATIVE
Ketones, ur: NEGATIVE mg/dL
Nitrite: NEGATIVE
Protein, ur: NEGATIVE mg/dL
Specific Gravity, Urine: 1.003 — ABNORMAL LOW (ref 1.005–1.030)
pH: 7 (ref 5.0–8.0)

## 2024-04-05 LAB — CBC WITH DIFFERENTIAL/PLATELET
Abs Immature Granulocytes: 0.05 10*3/uL (ref 0.00–0.07)
Basophils Absolute: 0 10*3/uL (ref 0.0–0.1)
Basophils Relative: 0 %
Eosinophils Absolute: 0 10*3/uL (ref 0.0–0.5)
Eosinophils Relative: 0 %
HCT: 37.4 % (ref 36.0–46.0)
Hemoglobin: 12.5 g/dL (ref 12.0–15.0)
Immature Granulocytes: 1 %
Lymphocytes Relative: 10 %
Lymphs Abs: 1.1 10*3/uL (ref 0.7–4.0)
MCH: 32.6 pg (ref 26.0–34.0)
MCHC: 33.4 g/dL (ref 30.0–36.0)
MCV: 97.4 fL (ref 80.0–100.0)
Monocytes Absolute: 0.8 10*3/uL (ref 0.1–1.0)
Monocytes Relative: 8 %
Neutro Abs: 8.1 10*3/uL — ABNORMAL HIGH (ref 1.7–7.7)
Neutrophils Relative %: 81 %
Platelets: 204 10*3/uL (ref 150–400)
RBC: 3.84 MIL/uL — ABNORMAL LOW (ref 3.87–5.11)
RDW: 12.4 % (ref 11.5–15.5)
WBC: 10.1 10*3/uL (ref 4.0–10.5)
nRBC: 0 % (ref 0.0–0.2)

## 2024-04-05 LAB — PROTIME-INR
INR: 2.5 — ABNORMAL HIGH (ref 0.8–1.2)
Prothrombin Time: 28 s — ABNORMAL HIGH (ref 11.4–15.2)

## 2024-04-05 MED ORDER — ALBUTEROL SULFATE HFA 108 (90 BASE) MCG/ACT IN AERS: 2.0000 | INHALATION_SPRAY | RESPIRATORY_TRACT | Status: DC | PRN

## 2024-04-05 MED ORDER — DILTIAZEM HCL 25 MG/5ML IV SOLN
10.0000 mg | Freq: Once | INTRAVENOUS | Status: AC
  Administered 2024-04-05: 10 mg via INTRAVENOUS
  Filled 2024-04-05: qty 5

## 2024-04-05 MED ORDER — ALBUTEROL SULFATE (2.5 MG/3ML) 0.083% IN NEBU
2.5000 mg | INHALATION_SOLUTION | RESPIRATORY_TRACT | Status: DC | PRN
  Administered 2024-04-08 – 2024-04-11 (×3): 2.5 mg via RESPIRATORY_TRACT
  Filled 2024-04-05 (×3): qty 3

## 2024-04-05 MED ORDER — DIPHENHYDRAMINE HCL 50 MG/ML IJ SOLN: 12.5000 mg | Freq: Three times a day (TID) | INTRAMUSCULAR | Status: DC | PRN

## 2024-04-05 MED ORDER — ACETAMINOPHEN 325 MG PO TABS: 650.0000 mg | ORAL_TABLET | Freq: Four times a day (QID) | ORAL | Status: DC | PRN

## 2024-04-05 MED ORDER — DM-GUAIFENESIN ER 30-600 MG PO TB12: 1.0000 | ORAL_TABLET | Freq: Two times a day (BID) | ORAL | Status: DC | PRN

## 2024-04-05 MED ORDER — SODIUM CHLORIDE 0.9 % IV BOLUS
500.0000 mL | Freq: Once | INTRAVENOUS | Status: AC
  Administered 2024-04-05: 500 mL via INTRAVENOUS

## 2024-04-05 MED ORDER — HYDRALAZINE HCL 20 MG/ML IJ SOLN: 5.0000 mg | INTRAMUSCULAR | Status: DC | PRN

## 2024-04-05 MED ORDER — DILTIAZEM HCL-DEXTROSE 125-5 MG/125ML-% IV SOLN (PREMIX)
5.0000 mg/h | INTRAVENOUS | Status: DC
  Administered 2024-04-05: 5 mg/h via INTRAVENOUS
  Filled 2024-04-05: qty 125

## 2024-04-05 NOTE — ED Triage Notes (Signed)
 Patient comes in by [facility] from [facility] complaining of weakness and fever.  Recently diagnosed with uncontrolled a-fib.  EMS reports 101.3 temp.  HR 130-160.  4 mg zofran and 500ml LR.

## 2024-04-05 NOTE — H&P (Signed)
 History and Physical    Amy Shah FMW:[id number] DOB: 1935-01-15 DOA: 04/05/2024  Referring MD/NP/PA:   PCP: [facility]   Patient coming from:  The patient is coming from home.     Chief Complaint:   HPI: Amy Shah is a 89 y.o. female with medical history significant of      Data reviewed independently and ED Course: pt was found to have     ***       EKG: I have personally reviewed.  Not done in ED, will get one.   ***   Review of Systems:   General: no fevers, chills, no body weight gain, has poor appetite, has fatigue HEENT: no blurry vision, hearing changes or sore throat Respiratory: no dyspnea, coughing, wheezing CV: no chest pain, no palpitations GI: no nausea, vomiting, abdominal pain, diarrhea, constipation GU: no dysuria, burning on urination, increased urinary frequency, hematuria  Ext: no leg edema Neuro: no unilateral weakness, numbness, or tingling, no vision change or hearing loss Skin: no rash, no skin tear. MSK: No muscle spasm, no deformity, no limitation of range of movement in spin Heme: No easy bruising.  Travel history: No recent long distant travel.   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 She has never used smokeless tobacco. She reports that she does not currently use alcohol. She reports that she does not use drugs.  Family History:  Family History  Problem Relation Age of Onset   Cancer Mother    Stroke Father      Prior to Admission medications   Medication Sig Start Date End Date Taking? Authorizing Provider  acetaminophen (TYLENOL) 500 MG tablet Take 1,000 mg by mouth 2 (two) times daily.    [provider]  albuterol  (PROAIR  HFA) 108 (90 Base) MCG/ACT inhaler Inhale 1 puff into the lungs every 6 (six) hours as needed. 04/20/22   Joshua Cathryne BROCKS, MD  amoxicillin (AMOXIL) 500 MG capsule Take 500 mg by mouth. 4 caps 1 hr before dental procedure    [provider]  calcium carbonate (TUMS - DOSED IN MG ELEMENTAL CALCIUM) 500 MG chewable tablet Chew 1 tablet by mouth as needed for indigestion or heartburn. For reflux    [provider]  carboxymethylcellulose (REFRESH PLUS) 0.5 % SOLN 1 drop daily as needed.    [provider]  Cholecalciferol (VITAMIN D-3) 25 MCG (1000 UT)  CAPS Take 1 capsule by mouth daily.    [provider]  Ibuprofen 200 MG CAPS Take by mouth.    [provider]  meclizine  (ANTIVERT ) 12.5 MG tablet Take 12.5 mg by mouth as needed for dizziness. dizziness    [provider]  metoprolol succinate (TOPROL XL) 50 MG 24 hr tablet Take 1 tablet (50 mg total) by mouth daily. Take with or immediately following a meal. 04/03/24 07/02/24  Willo Dunnings, MD  Multiple Vitamins-Minerals (PRESERVISION AREDS PO) Take 2 tablets by mouth daily.    [provider]  rivaroxaban  (XARELTO ) 20 MG TABS tablet Take 1 tablet (20 mg total) by mouth daily with supper. 03/17/24   Ward, Josette SAILOR, DO  traZODone  (DESYREL ) 50 MG tablet Take 0.5-1 tablets (25-50 mg total) by mouth at bedtime as  needed for sleep. 04/24/22   Joshua Cathryne BROCKS, MD    Physical Exam: Vitals:   04/05/24 1943 04/05/24 2130 04/05/24 2200  BP: (!) 153/88 (!) 143/92 128/64  Pulse: (!) 124 (!) 112 90  Resp: (!) 21 (!) 22   Temp: 99.7 F (37.6 C)    TempSrc: Oral    SpO2: 99% 99% 98%   General: Not in acute distress HEENT:       Eyes: PERRL, EOMI, no jaundice       ENT: No discharge from the ears and nose, no pharynx injection, no tonsillar enlargement.        Neck: No JVD, no bruit, no mass felt. Heme: No neck lymph node enlargement. Cardiac: S1/S2, RRR, No murmurs, No gallops or rubs. Respiratory: No rales, wheezing, rhonchi or rubs. GI: Soft, nondistended, nontender, no rebound pain, no organomegaly, BS present. GU: No hematuria Ext: No pitting leg edema bilaterally. 1+DP/PT pulse bilaterally. Musculoskeletal: No joint deformities, No joint redness or warmth, no limitation of ROM in spin. Skin: No rashes.  Neuro: Alert, oriented X3, cranial nerves II-XII grossly intact, moves all extremities normally. Muscle strength 5/5 in all extremities, sensation to light touch intact. Brachial reflex 2+ bilaterally. Knee reflex 1+ bilaterally. Negative Babinski's sign. Normal finger to nose test. Psych: Patient is not psychotic, no suicidal or hemocidal ideation.  Labs on Admission: I have personally reviewed following labs and imaging studies  CBC: Recent Labs  Lab 04/03/24 1354 04/05/24 2059  WBC 6.7 10.1  NEUTROABS  --  8.1*  HGB 12.3 12.5  HCT 37.1 37.4  MCV 98.1 97.4  PLT 189 204   Basic Metabolic Panel: Recent Labs  Lab 04/03/24 1354 04/05/24 2059  NA 135 136  K 4.1 3.8  CL 104 102  CO2 22 23  GLUCOSE 111* 123*  BUN 15 14  CREATININE 0.63 0.67  CALCIUM 8.5* 8.8*  MG 2.1  --    GFR: Estimated Creatinine Clearance: 46.8 mL/min (by C-G formula based on SCr of 0.67 mg/dL). Liver Function Tests: Recent Labs  Lab 04/03/24 1354 04/05/24 2059  AST 35 35  ALT 27 33  ALKPHOS 84 97   BILITOT 0.7 1.3*  PROT 6.2* 6.7  ALBUMIN 3.7 3.9   No results for input(s): LIPASE, AMYLASE in the last 168 hours. No results for input(s): AMMONIA in the last 168 hours. Coagulation Profile: Recent Labs  Lab 04/05/24 2059  INR 2.5*   Cardiac Enzymes: No results for input(s): CKTOTAL, CKMB, CKMBINDEX, TROPONINI in the last 168 hours. BNP (last 3 results) No results for input(s): PROBNP in the last 8760 hours. HbA1C: No results for input(s): HGBA1C in the last  72 hours. CBG: No results for input(s): GLUCAP in the last 168 hours. Lipid Profile: No results for input(s): CHOL, HDL, LDLCALC, TRIG, CHOLHDL, LDLDIRECT in the last 72 hours. Thyroid  Function Tests: No results for input(s): TSH, T4TOTAL, FREET4, T3FREE, THYROIDAB in the last 72 hours. Anemia Panel: No results for input(s): VITAMINB12, FOLATE, FERRITIN, TIBC, IRON, RETICCTPCT in the last 72 hours. Urine analysis:    Component Value Date/Time   COLORURINE STRAW (A) 04/05/2024 2059   APPEARANCEUR CLEAR (A) 04/05/2024 2059   APPEARANCEUR Clear 01/11/2015 1058   LABSPEC 1.003 (L) 04/05/2024 2059   LABSPEC 1.009 01/11/2015 1058   PHURINE 7.0 04/05/2024 2059   GLUCOSEU NEGATIVE 04/05/2024 2059   GLUCOSEU Negative 01/11/2015 1058   HGBUR NEGATIVE 04/05/2024 2059   BILIRUBINUR NEGATIVE 04/05/2024 2059   BILIRUBINUR Negative 01/11/2015 1058   KETONESUR NEGATIVE 04/05/2024 2059   PROTEINUR NEGATIVE 04/05/2024 2059   NITRITE NEGATIVE 04/05/2024 2059   LEUKOCYTESUR TRACE (A) 04/05/2024 2059   LEUKOCYTESUR Negative 01/11/2015 1058   Sepsis Labs: @LABRCNTIP (procalcitonin:4,lacticidven:4) )No results found for this or any previous visit (from the past 240 hours).   Radiological Exams on Admission:   Assessment/Plan Active Problems:   * No active hospital problems. *   Assessment and Plan: No notes have been filed under this hospital service. Service:  Hospitalist      Active Problems:   * No active hospital problems. *    DVT ppx: SQ Heparin         SQ Lovenox  Code Status: Full code   ***  Family Communication:     not done, no family member is at bed side.              Yes, patient's    at bed side.       by phone   ***  Disposition Plan:  Anticipate discharge back to previous environment  Consults called:    Admission status and Level of care: :    for obs as inpt        Dispo: The patient is from: {From:23814}              Anticipated d/c is to: {To:23815}              Anticipated d/c date is: {Days:23816}              Patient currently {Medically stable:23817}    Severity of Illness:  {Observation/Inpatient:21159}       Date of Service 04/05/2024    Caleb Exon [facility]   If 7PM-7AM, please contact night-coverage [email or web address] 04/05/2024, 10:34 PM

## 2024-04-05 NOTE — ED Provider Notes (Signed)
 [facility] Provider Note    Event Date/Time   First MD Initiated Contact with Patient 04/05/24 1934     (approximate)   History   Weakness and Fever   HPI  Amy Shah is a 89 y.o. female with history of atrial fibrillation presents with complaints of weakness, fever reportedly.  She also notes that she has had a cough which developed a few days ago.     Physical Exam   Triage Vital Signs: ED Triage Vitals  Encounter Vitals Group     BP 04/05/24 1943 (!) 153/88     Girls Systolic BP Percentile --      Girls Diastolic BP Percentile --      Boys Systolic BP Percentile --      Boys Diastolic BP Percentile --      Pulse Rate 04/05/24 1943 (!) 124     Resp 04/05/24 1943 (!) 21     Temp 04/05/24 1943 99.7 F (37.6 C)     Temp Source 04/05/24 1943 Oral     SpO2 04/05/24 1943 99 %     Weight --      Height --      Head Circumference --      Peak Flow --      Pain Score 04/05/24 1944 0     Pain Loc --      Pain Education --      Exclude from Growth Chart --     Most recent vital signs: Vitals:   04/05/24 2130 04/05/24 2200  BP: (!) 143/92 128/64  Pulse: (!) 112 90  Resp: (!) 22   Temp:    SpO2: 99% 98%     General: Awake, no distress.  CV:  Good peripheral perfusion.  Irregularly irregular rhythm, tachycardia Resp:   Mild tachypnea Abd:  No distention.  Other:     ED Results / Procedures / Treatments   Labs (all labs ordered are listed, but only abnormal results are displayed) Labs Reviewed  COMPREHENSIVE METABOLIC PANEL WITH GFR - Abnormal; Notable for the following components:      Result Value   Glucose, Bld 123 (*)    Calcium 8.8 (*)    Total Bilirubin 1.3 (*)    All other components within normal limits  CBC WITH DIFFERENTIAL/PLATELET - Abnormal; Notable for the following components:   RBC 3.84 (*)    Neutro Abs 8.1 (*)    All other components within normal limits  PROTIME-INR - Abnormal; Notable for the following  components:   Prothrombin Time 28.0 (*)    INR 2.5 (*)    All other components within normal limits  URINALYSIS, W/ REFLEX TO CULTURE (INFECTION SUSPECTED) - Abnormal; Notable for the following components:   Color, Urine STRAW (*)    APPearance CLEAR (*)    Specific Gravity, Urine 1.003 (*)    Leukocytes,Ua TRACE (*)    All other components within normal limits  CULTURE, BLOOD (ROUTINE X 2)  CULTURE, BLOOD (ROUTINE X 2)  URINE CULTURE  LACTIC ACID, PLASMA     EKG  ED ECG REPORT I, Lamar Price, the attending physician, personally viewed and interpreted this ECG.  Date: 04/05/2024  Rhythm: Atrial fibrillation with RVR QRS Axis: normal Intervals: Abnormal ST/T Wave abnormalities: normal Narrative Interpretation: Abnormal    RADIOLOGY Chest x-ray demonstrates small bilateral effusions    PROCEDURES:  Critical Care performed: yes  CRITICAL CARE Performed by: Lamar Price   Total critical  care time: 30 minutes  Critical care time was exclusive of separately billable procedures and treating other patients.  Critical care was necessary to treat or prevent imminent or life-threatening deterioration.  Critical care was time spent personally by me on the following activities: development of treatment plan with patient and/or surrogate as well as nursing, discussions with consultants, evaluation of patient's response to treatment, examination of patient, obtaining history from patient or surrogate, ordering and performing treatments and interventions, ordering and review of laboratory studies, ordering and review of radiographic studies, pulse oximetry and re-evaluation of patient's condition.   Procedures   MEDICATIONS ORDERED IN ED: Medications  diltiazem  (CARDIZEM ) 125 mg in dextrose 5% 125 mL (1 mg/mL) infusion (has no administration in time range)  sodium chloride  0.9 % bolus 500 mL (500 mLs Intravenous New Bag/Given 04/05/24 2156)  diltiazem  (CARDIZEM )  injection 10 mg (10 mg Intravenous Given 04/05/24 2156)     IMPRESSION / MDM / ASSESSMENT AND PLAN / ED COURSE  I reviewed the triage vital signs and the nursing notes. Patient's presentation is most consistent with acute presentation with potential threat to life or bodily function.  Patient presents with orts of fever, weakness, cough, suspicious for sepsis versus pneumonia versus viral illness.  She has recent diagnosis of atrial fibrillation and her heart rate is elevated, however if this is related to infection we will not try to slow it at this time  Labs x-ray pending  ----------------------------------------- 10:25 PM on 04/05/2024 ----------------------------------------- Lab work is reassuring, no signs of pneumonia, white blood cell count is reassuring, lactic is reassuring.  Patient's heart rate remains significantly elevated, will give a dose of IV Cardizem   Continued elevation after IV Cardizem , will place the patient on Cardizem  drip, will consult the hospitalist for admission for heart rate control and further evaluation      FINAL CLINICAL IMPRESSION(S) / ED DIAGNOSES   Final diagnoses:  Atrial fibrillation with rapid ventricular response (HCC)     Rx / DC Orders   ED Discharge Orders     None        Note:  This document was prepared using Dragon voice recognition software and may include unintentional dictation errors.   Arlander Charleston, MD 04/05/24 2226

## 2024-04-06 DIAGNOSIS — I4891 Unspecified atrial fibrillation: Principal | ICD-10-CM | POA: Diagnosis present

## 2024-04-06 DIAGNOSIS — R350 Frequency of micturition: Secondary | ICD-10-CM | POA: Diagnosis present

## 2024-04-06 DIAGNOSIS — Z7901 Long term (current) use of anticoagulants: Secondary | ICD-10-CM | POA: Diagnosis not present

## 2024-04-06 DIAGNOSIS — R0609 Other forms of dyspnea: Secondary | ICD-10-CM | POA: Diagnosis not present

## 2024-04-06 DIAGNOSIS — J209 Acute bronchitis, unspecified: Secondary | ICD-10-CM | POA: Diagnosis present

## 2024-04-06 DIAGNOSIS — R051 Acute cough: Secondary | ICD-10-CM | POA: Diagnosis not present

## 2024-04-06 DIAGNOSIS — E663 Overweight: Secondary | ICD-10-CM | POA: Diagnosis present

## 2024-04-06 DIAGNOSIS — Z90710 Acquired absence of both cervix and uterus: Secondary | ICD-10-CM | POA: Diagnosis not present

## 2024-04-06 DIAGNOSIS — I5031 Acute diastolic (congestive) heart failure: Secondary | ICD-10-CM | POA: Diagnosis not present

## 2024-04-06 DIAGNOSIS — Z79899 Other long term (current) drug therapy: Secondary | ICD-10-CM | POA: Diagnosis not present

## 2024-04-06 DIAGNOSIS — I10 Essential (primary) hypertension: Secondary | ICD-10-CM | POA: Diagnosis not present

## 2024-04-06 DIAGNOSIS — I110 Hypertensive heart disease with heart failure: Secondary | ICD-10-CM | POA: Diagnosis not present

## 2024-04-06 DIAGNOSIS — Z66 Do not resuscitate: Secondary | ICD-10-CM | POA: Diagnosis present

## 2024-04-06 DIAGNOSIS — R059 Cough, unspecified: Secondary | ICD-10-CM | POA: Diagnosis not present

## 2024-04-06 DIAGNOSIS — B349 Viral infection, unspecified: Secondary | ICD-10-CM | POA: Diagnosis present

## 2024-04-06 DIAGNOSIS — Z823 Family history of stroke: Secondary | ICD-10-CM | POA: Diagnosis not present

## 2024-04-06 DIAGNOSIS — N814 Uterovaginal prolapse, unspecified: Secondary | ICD-10-CM | POA: Diagnosis present

## 2024-04-06 DIAGNOSIS — Z96642 Presence of left artificial hip joint: Secondary | ICD-10-CM | POA: Diagnosis present

## 2024-04-06 DIAGNOSIS — Z6827 Body mass index (BMI) 27.0-27.9, adult: Secondary | ICD-10-CM | POA: Diagnosis not present

## 2024-04-06 DIAGNOSIS — E785 Hyperlipidemia, unspecified: Secondary | ICD-10-CM | POA: Diagnosis present

## 2024-04-06 LAB — T4, FREE: Free T4: 1 ng/dL (ref 0.61–1.12)

## 2024-04-06 LAB — BLOOD CULTURE ID PANEL (REFLEXED) - BCID2

## 2024-04-06 LAB — RESPIRATORY PANEL BY PCR

## 2024-04-06 LAB — BASIC METABOLIC PANEL WITH GFR
Anion gap: 9 (ref 5–15)
BUN: 10 mg/dL (ref 8–23)
CO2: 23 mmol/L (ref 22–32)
Calcium: 8.7 mg/dL — ABNORMAL LOW (ref 8.9–10.3)
Chloride: 103 mmol/L (ref 98–111)
Creatinine, Ser: 0.64 mg/dL (ref 0.44–1.00)
GFR, Estimated: >60 mL/min (ref >60)
Glucose, Bld: 148 mg/dL — ABNORMAL HIGH (ref 70–99)
Potassium: 3.6 mmol/L (ref 3.5–5.1)
Sodium: 135 mmol/L (ref 135–145)

## 2024-04-06 LAB — CBC
HCT: 37.3 % (ref 36.0–46.0)
Hemoglobin: 12.6 g/dL (ref 12.0–15.0)
MCH: 33.1 pg (ref 26.0–34.0)
MCHC: 33.8 g/dL (ref 30.0–36.0)
MCV: 97.9 fL (ref 80.0–100.0)
Platelets: 187 10*3/uL (ref 150–400)
RBC: 3.81 MIL/uL — ABNORMAL LOW (ref 3.87–5.11)
RDW: 12.3 % (ref 11.5–15.5)
WBC: 9.2 10*3/uL (ref 4.0–10.5)
nRBC: 0 % (ref 0.0–0.2)

## 2024-04-06 LAB — TSH: TSH: 1.6 u[IU]/mL (ref 0.350–4.500)

## 2024-04-06 LAB — SARS CORONAVIRUS 2 BY RT PCR: SARS Coronavirus 2 by RT PCR: NEGATIVE

## 2024-04-06 LAB — PROCALCITONIN: Procalcitonin: <0.1 ng/mL

## 2024-04-06 MED ORDER — RIVAROXABAN 20 MG PO TABS
20.0000 mg | ORAL_TABLET | Freq: Every day | ORAL | Status: DC
  Administered 2024-04-06 – 2024-04-11 (×6): 20 mg via ORAL
  Filled 2024-04-06 (×8): qty 1

## 2024-04-06 MED ORDER — BENZONATATE 100 MG PO CAPS
200.0000 mg | ORAL_CAPSULE | Freq: Three times a day (TID) | ORAL | Status: DC
  Administered 2024-04-06 – 2024-04-11 (×17): 200 mg via ORAL
  Filled 2024-04-06 (×17): qty 2

## 2024-04-06 MED ORDER — LISINOPRIL 10 MG PO TABS: 10.0000 mg | ORAL_TABLET | Freq: Every day | ORAL | Status: DC

## 2024-04-06 MED ORDER — VITAMIN D3 25 MCG (1000 UNIT) PO TABS
1000.0000 [IU] | ORAL_TABLET | Freq: Every day | ORAL | Status: DC
  Administered 2024-04-06 – 2024-04-11 (×6): 1000 [IU] via ORAL
  Filled 2024-04-06 (×12): qty 1

## 2024-04-06 MED ORDER — METOPROLOL SUCCINATE ER 50 MG PO TB24
50.0000 mg | ORAL_TABLET | Freq: Every day | ORAL | Status: DC
  Administered 2024-04-06 – 2024-04-07 (×2): 50 mg via ORAL
  Filled 2024-04-06 (×2): qty 1

## 2024-04-06 MED ORDER — POLYVINYL ALCOHOL 1.4 % OP SOLN
1.0000 [drp] | Freq: Every day | OPHTHALMIC | Status: DC | PRN
  Filled 2024-04-06: qty 15

## 2024-04-06 MED ORDER — PROSIGHT PO TABS
1.0000 | ORAL_TABLET | Freq: Every day | ORAL | Status: DC
  Administered 2024-04-06 – 2024-04-11 (×5): 1 via ORAL
  Filled 2024-04-06 (×6): qty 1

## 2024-04-06 MED ORDER — METHYLPREDNISOLONE SODIUM SUCC 40 MG IJ SOLR
40.0000 mg | Freq: Every day | INTRAMUSCULAR | Status: AC
  Administered 2024-04-06 – 2024-04-07 (×2): 40 mg via INTRAVENOUS
  Filled 2024-04-06 (×2): qty 1

## 2024-04-06 NOTE — Progress Notes (Addendum)
 PROGRESS NOTE    Amy Shah  FMW:[id number] DOB: October 19, 1934 DOA: 04/05/2024 PCP: [facility]    Brief Narrative:  89 y.o. female with medical history significant of hypertension, hyperlipidemia, A-fib on Xarelto , vertigo, who presents with weakness, subjective fever and cough.   Patient states that she has generalized weakness in the past several days.  States last night she developed fever of 101.3.  Her temperature is 99.7 in ED.  She also reports dry cough, mild SOB, no chest pain.  She states that she had sore throat few days ago which has resolved currently.  Patient has nausea, poor appetite, decreased oral intake, no vomiting, diarrhea or abdominal pain.  Patient denies dysuria or burning with urination.  She states that she has uterine prolapse, with chronic urinary frequency which has not changed.  Assessment & Plan:   Principal Problem:   Atrial fibrillation with RVR (HCC) Active Problems:   Cough   HTN (hypertension)   Overweight (BMI 25.0-29.9)   Atrial fibrillation with RVR (HCC):  Remains on Cardizem  gtt. at 7.5 cc an hour Plan: Continue Cardizem  gtt.  Wean as tolerated Goal heart rate less than 110 Continue metoprolol 50 mg daily Continue Xarelto    Cough:  Possibly due to viral infection.  Chest x-ray negative for infiltration.  No oxygen desaturation. Negative procalcitonin.  No indication of infection RVP and COVID-negative Plan: As needed mucolytic's and bronchodilators Empiric Solu-Medrol 40 mg IV daily Tessalon Perles 3 times daily  HTN (hypertension) Cardizem  gtt. as above Continue metoprolol Hold lisinopril for now   Overweight (BMI 25.0-29.9):  Body weight 79.2 kg, BMI 27.81 Complicates overall care and prognosis   Subjective fever:   Afebrile in ED.  Possibly secondary to viral infection.  Procalcitonin negative.  No signs of bacterial infection.  No indication for antibiotics.  Continue to follow cultures  DVT  prophylaxis: Xarelto  Code Status: DNR Family Communication:Daughter via phone 6/30 Disposition Plan: Status is: Observation The patient will require care spanning > 2 midnights and should be moved to inpatient because: Afib RVR   Level of care: Progressive  Consultants:  None  Procedures:  None  Antimicrobials: None    Subjective: Seen and examined.  Resting in bed.  Reports of palpitations otherwise stable.  Objective: Vitals:   04/06/24 1200 04/06/24 1300 04/06/24 1330 04/06/24 1400  BP: (!) 111/95 125/80 130/77 129/87  Pulse: (!) 114 (!) 106 (!) 103 (!) 105  Resp: 20 19 (!) 26 (!) 22  Temp:      TempSrc:      SpO2: 99% 97% 98% 98%   No intake or output data in the 24 hours ending 04/06/24 1416 There were no vitals filed for this visit.  Examination:  General exam: NAD Respiratory system: Lungs clear.  Normal work of breathing.  Room air Cardiovascular system: S1-S2, rapid rate, irregular rhythm, no murmurs Gastrointestinal system: Soft, NT/ND, normal bowel sounds Central nervous system: Alert and oriented. No focal neurological deficits. Extremities: Symmetric 5 x 5 power. Skin: No rashes, lesions or ulcers Psychiatry: Judgement and insight appear normal. Mood & affect appropriate.     Data Reviewed: I have personally reviewed following labs and imaging studies  CBC: Recent Labs  Lab 04/03/24 1354 04/05/24 2059 04/06/24 0711  WBC 6.7 10.1 9.2  NEUTROABS  --  8.1*  --   HGB 12.3 12.5 12.6  HCT 37.1 37.4 37.3  MCV 98.1 97.4 97.9  PLT 189 204 187   Basic Metabolic Panel: Recent  Labs  Lab 04/03/24 1354 04/05/24 2059 04/06/24 0711  NA 135 136 135  K 4.1 3.8 3.6  CL 104 102 103  CO2 22 23 23   GLUCOSE 111* 123* 148*  BUN 15 14 10   CREATININE 0.63 0.67 0.64  CALCIUM 8.5* 8.8* 8.7*  MG 2.1  --   --    GFR: Estimated Creatinine Clearance: 46.8 mL/min (by C-G formula based on SCr of 0.64 mg/dL). Liver Function Tests: Recent Labs  Lab  04/03/24 1354 04/05/24 2059  AST 35 35  ALT 27 33  ALKPHOS 84 97  BILITOT 0.7 1.3*  PROT 6.2* 6.7  ALBUMIN 3.7 3.9   No results for input(s): LIPASE, AMYLASE in the last 168 hours. No results for input(s): AMMONIA in the last 168 hours. Coagulation Profile: Recent Labs  Lab 04/05/24 2059  INR 2.5*   Cardiac Enzymes: No results for input(s): CKTOTAL, CKMB, CKMBINDEX, TROPONINI in the last 168 hours. BNP (last 3 results) No results for input(s): PROBNP in the last 8760 hours. HbA1C: No results for input(s): HGBA1C in the last 72 hours. CBG: No results for input(s): GLUCAP in the last 168 hours. Lipid Profile: No results for input(s): CHOL, HDL, LDLCALC, TRIG, CHOLHDL, LDLDIRECT in the last 72 hours. Thyroid  Function Tests: Recent Labs    04/06/24 0711  TSH 1.600  FREET4 1.00   Anemia Panel: No results for input(s): VITAMINB12, FOLATE, FERRITIN, TIBC, IRON, RETICCTPCT in the last 72 hours. Sepsis Labs: Recent Labs  Lab 04/05/24 2059 04/06/24 0711  PROCALCITON  --  <0.10  LATICACIDVEN 1.2  --     Recent Results (from the past 240 hours)  Blood Culture (routine x 2)     Status: None (Preliminary result)   Collection Time: 04/05/24  8:59 PM   Specimen: BLOOD RIGHT ARM  Result Value Ref Range Status   Specimen Description BLOOD RIGHT ARM  Final   Special Requests   Final    BOTTLES DRAWN AEROBIC AND ANAEROBIC Blood Culture results may not be optimal due to an inadequate volume of blood received in culture bottles   Culture  Setup Time   Final    GRAM POSITIVE COCCI ANAEROBIC BOTTLE ONLY Organism ID to follow    Culture   Final    NO GROWTH < 12 HOURS Performed at [facility], [address], [place], [place] [address]    Report Status PENDING  Incomplete  Blood Culture (routine x 2)     Status: None (Preliminary result)   Collection Time: 04/05/24  8:59 PM   Specimen: BLOOD LEFT ARM  Result Value Ref  Range Status   Specimen Description BLOOD LEFT ARM  Final   Special Requests   Final    BOTTLES DRAWN AEROBIC AND ANAEROBIC Blood Culture adequate volume   Culture   Final    NO GROWTH < 12 HOURS Performed at [facility], [address], [place], [place] [address]    Report Status PENDING  Incomplete  Respiratory (~20 pathogens) panel by PCR     Status: None   Collection Time: 04/05/24 11:26 PM   Specimen: Anterior Nasal Swab; Respiratory  Result Value Ref Range Status   Adenovirus NOT DETECTED NOT DETECTED Final   Coronavirus 229E NOT DETECTED NOT DETECTED Final    Comment: (NOTE) The Coronavirus on the Respiratory Panel, DOES NOT test for the novel  Coronavirus (2019 nCoV)    Coronavirus HKU1 NOT DETECTED NOT DETECTED Final   Coronavirus NL63 NOT DETECTED NOT  DETECTED Final   Coronavirus OC43 NOT DETECTED NOT DETECTED Final   Metapneumovirus NOT DETECTED NOT DETECTED Final   Rhinovirus / Enterovirus NOT DETECTED NOT DETECTED Final   Influenza A NOT DETECTED NOT DETECTED Final   Influenza B NOT DETECTED NOT DETECTED Final   Parainfluenza Virus 1 NOT DETECTED NOT DETECTED Final   Parainfluenza Virus 2 NOT DETECTED NOT DETECTED Final   Parainfluenza Virus 3 NOT DETECTED NOT DETECTED Final   Parainfluenza Virus 4 NOT DETECTED NOT DETECTED Final   Respiratory Syncytial Virus NOT DETECTED NOT DETECTED Final   Bordetella pertussis NOT DETECTED NOT DETECTED Final   Bordetella Parapertussis NOT DETECTED NOT DETECTED Final   Chlamydophila pneumoniae NOT DETECTED NOT DETECTED Final   Mycoplasma pneumoniae NOT DETECTED NOT DETECTED Final    Comment: Performed at [facility], [address], [place], [place] [address]  SARS Coronavirus 2 by RT PCR (hospital order, performed in [facility] hospital lab) *cepheid single result test* Anterior Nasal Swab     Status: None   Collection Time: 04/05/24 11:26 PM   Specimen: Anterior Nasal Swab  Result Value Ref Range Status    SARS Coronavirus 2 by RT PCR NEGATIVE NEGATIVE Final    Comment: (NOTE) SARS-CoV-2 target nucleic acids are NOT DETECTED.  The SARS-CoV-2 RNA is generally detectable in upper and lower respiratory specimens during the acute phase of infection. The lowest concentration of SARS-CoV-2 viral copies this assay can detect is 250 copies / mL. A negative result does not preclude SARS-CoV-2 infection and should not be used as the sole basis for treatment or other patient management decisions.  A negative result may occur with improper specimen collection / handling, submission of specimen other than nasopharyngeal swab, presence of viral mutation(s) within the areas targeted by this assay, and inadequate number of viral copies (<250 copies / mL). A negative result must be combined with clinical observations, patient history, and epidemiological information.  Fact Sheet for Patients:   RoadLapTop.co.za  Fact Sheet for Healthcare Providers: http://kim-miller.com/  This test is not yet approved or  cleared by the [place]  FDA and has been authorized for detection and/or diagnosis of SARS-CoV-2 by FDA under an Emergency Use Authorization (EUA).  This EUA will remain in effect (meaning this test can be used) for the duration of the COVID-19 declaration under Section 564(b)(1) of the Act, 21 U.S.C. section 360bbb-3(b)(1), unless the authorization is terminated or revoked sooner.  Performed at [facility], [address] [place], [place] [address]          Radiology Studies: [facility] Chest [place] 1 View Result Date: 04/05/2024 CLINICAL DATA:  Questionable sepsis - evaluate for abnormality EXAM: PORTABLE CHEST - 1 VIEW COMPARISON:  April 03, 2024 FINDINGS: Bibasilar atelectasis. No pneumothorax. Small bilateral pleural effusions. Mild cardiomegaly. Tortuous aorta with aortic atherosclerosis. No acute fracture or destructive lesion. Unchanged  focal scoliosis of the upper thoracic spine. IMPRESSION: Small bilateral pleural effusions with bibasilar atelectasis. Electronically Signed   By: Rogelia Myers M.D.   On: 04/05/2024 20:35        Scheduled Meds:  benzonatate  200 mg Oral TID   cholecalciferol  1,000 Units Oral Daily   methylPREDNISolone (SOLU-MEDROL) injection  40 mg Intravenous Daily   metoprolol succinate  50 mg Oral Daily   multivitamin  1 tablet Oral Daily   rivaroxaban   20 mg Oral Q supper   Continuous Infusions:  diltiazem  (CARDIZEM ) infusion Stopped (04/06/24 1053)  LOS: 0 days    Calvin KATHEE Robson, MD [facility]   If 7PM-7AM, please contact night-coverage  04/06/2024, 2:16 PM

## 2024-04-06 NOTE — ED Notes (Signed)
 Pt assisted to toilet from bed. Pt able to stand independently and ambulate with walker from bed to toilet.

## 2024-04-06 NOTE — Progress Notes (Signed)
 PHARMACY - PHYSICIAN COMMUNICATION CRITICAL VALUE ALERT - BLOOD CULTURE IDENTIFICATION (BCID)  Amy Shah is an 89 y.o. female who presented to [facility] on 04/05/2024 with a chief complaint of weakness, cough, subjective fever  Assessment:  6/29 blood culture with GPC in 1 of 4 bottles, BCID detects Methicillin-resistant S. Epidermidis.   Name of physician (or Provider) Contacted: Dr Jhonny  Current antibiotics: none  Changes to prescribed antibiotics recommended:  Continue to monitor off antibiotics as suspect contaminant  Results for orders placed or performed during the hospital encounter of 04/05/24  Blood Culture ID Panel (Reflexed) (Collected: 04/05/2024  8:59 PM)  Result Value Ref Range   Enterococcus faecalis NOT DETECTED NOT DETECTED   Enterococcus Faecium NOT DETECTED NOT DETECTED   Listeria monocytogenes NOT DETECTED NOT DETECTED   Staphylococcus species DETECTED (A) NOT DETECTED   Staphylococcus aureus (BCID) NOT DETECTED NOT DETECTED   Staphylococcus epidermidis DETECTED (A) NOT DETECTED   Staphylococcus lugdunensis NOT DETECTED NOT DETECTED   Streptococcus species NOT DETECTED NOT DETECTED   Streptococcus agalactiae NOT DETECTED NOT DETECTED   Streptococcus pneumoniae NOT DETECTED NOT DETECTED   Streptococcus pyogenes NOT DETECTED NOT DETECTED   A.calcoaceticus-baumannii NOT DETECTED NOT DETECTED   Bacteroides fragilis NOT DETECTED NOT DETECTED   Enterobacterales NOT DETECTED NOT DETECTED   Enterobacter cloacae complex NOT DETECTED NOT DETECTED   Escherichia coli NOT DETECTED NOT DETECTED   Klebsiella aerogenes NOT DETECTED NOT DETECTED   Klebsiella oxytoca NOT DETECTED NOT DETECTED   Klebsiella pneumoniae NOT DETECTED NOT DETECTED   Proteus species NOT DETECTED NOT DETECTED   Salmonella species NOT DETECTED NOT DETECTED   Serratia marcescens NOT DETECTED NOT DETECTED   Haemophilus influenzae NOT DETECTED NOT DETECTED   Neisseria meningitidis NOT DETECTED  NOT DETECTED   Pseudomonas aeruginosa NOT DETECTED NOT DETECTED   Stenotrophomonas maltophilia NOT DETECTED NOT DETECTED   Candida albicans NOT DETECTED NOT DETECTED   Candida auris NOT DETECTED NOT DETECTED   Candida glabrata NOT DETECTED NOT DETECTED   Candida krusei NOT DETECTED NOT DETECTED   Candida parapsilosis NOT DETECTED NOT DETECTED   Candida tropicalis NOT DETECTED NOT DETECTED   Cryptococcus neoformans/gattii NOT DETECTED NOT DETECTED   Methicillin resistance mecA/C DETECTED (A) NOT DETECTED    Celestine Slovak, PharmD, BCPS, BCIDP Work Cell: [phone] 04/06/2024 3:21 PM

## 2024-04-06 NOTE — ED Notes (Signed)
 Breakfast tray at bedside. Pt assisted with sitting up to side of bed so that she can eat.

## 2024-04-06 NOTE — Progress Notes (Signed)
 Pt arrived to room 251 at this time. VSS on room air. Pt is in afib 100-120s. Pt is alert and oriented X4. Pt's daughter notified of pt in room.

## 2024-04-06 NOTE — Care Management Obs Status (Signed)
 MEDICARE OBSERVATION STATUS NOTIFICATION   Patient Details  Name: Amy Shah MRN: [id number] Date of Birth: [birth year]   Medicare Observation Status Notification Given:  Yes    [name], CMA 04/06/2024, 3:08 PM

## 2024-04-07 DIAGNOSIS — I4891 Unspecified atrial fibrillation: Secondary | ICD-10-CM | POA: Diagnosis not present

## 2024-04-07 MED ORDER — PREDNISONE 20 MG PO TABS
20.0000 mg | ORAL_TABLET | Freq: Every day | ORAL | Status: DC
  Administered 2024-04-08: 20 mg via ORAL
  Filled 2024-04-07: qty 1

## 2024-04-07 MED ORDER — GUAIFENESIN ER 600 MG PO TB12
600.0000 mg | ORAL_TABLET | Freq: Two times a day (BID) | ORAL | Status: DC
  Administered 2024-04-07 – 2024-04-11 (×9): 600 mg via ORAL
  Filled 2024-04-07 (×9): qty 1

## 2024-04-07 MED ORDER — METOPROLOL SUCCINATE ER 50 MG PO TB24
75.0000 mg | ORAL_TABLET | Freq: Every day | ORAL | Status: DC
  Administered 2024-04-08: 75 mg via ORAL
  Filled 2024-04-07: qty 1

## 2024-04-07 MED ORDER — METOPROLOL SUCCINATE ER 25 MG PO TB24
25.0000 mg | ORAL_TABLET | Freq: Once | ORAL | Status: AC
  Administered 2024-04-07: 25 mg via ORAL
  Filled 2024-04-07: qty 1

## 2024-04-07 NOTE — Plan of Care (Signed)

## 2024-04-07 NOTE — Progress Notes (Signed)
 PROGRESS NOTE    Amy Shah  FMW:[id number] DOB: 1935-06-01 DOA: 04/05/2024 PCP: [facility]    Brief Narrative:  89 y.o. female with medical history significant of hypertension, hyperlipidemia, A-fib on Xarelto , vertigo, who presents with weakness, subjective fever and cough.   Patient states that she has generalized weakness in the past several days.  States last night she developed fever of 101.3.  Her temperature is 99.7 in ED.  She also reports dry cough, mild SOB, no chest pain.  She states that she had sore throat few days ago which has resolved currently.  Patient has nausea, poor appetite, decreased oral intake, no vomiting, diarrhea or abdominal pain.  Patient denies dysuria or burning with urination.  She states that she has uterine prolapse, with chronic urinary frequency which has not changed.  Assessment & Plan:   Principal Problem:   Atrial fibrillation with RVR (HCC) Active Problems:   Cough   HTN (hypertension)   Overweight (BMI 25.0-29.9)   Atrial fibrillation with rapid ventricular response (HCC)   Atrial fibrillation with RVR (HCC):  Wean off Cardizem  gtt.  Rate rate improved but remains uncontrolled Plan: Increase metoprolol succinate  to 75 mg daily Continue Xarelto  Continue telemetry Goal heart rate under 110   Cough:  Possibly due to viral infection.  Chest x-ray negative for infiltration.  No oxygen desaturation. Negative procalcitonin.  No indication of infection RVP and COVID-negative Has cough productive of sputum Plan: Twice daily Mucinex As needed bronchodilators Solu-Medrol 40 mg IV daily today Prednisone 20 mg daily starting 7/2 3 times daily Tessalon Perles  HTN (hypertension) Increase dose of metoprolol succinate as above Hold lisinopril for now.  Consider restarting 7/2    Subjective fever:   Afebrile in ED.  Possibly secondary to viral infection.  Procalcitonin negative.  No signs of bacterial infection.  No  indication for antibiotics.  Continue to follow cultures  DVT prophylaxis: Xarelto  Code Status: DNR Family Communication:Daughter via phone 6/30 Disposition Plan: Status is: Inpatient Remains inpatient appropriate because: A-fib RVR.  Viral syndrome     Level of care: Telemetry Cardiac  Consultants:  None  Procedures:  None  Antimicrobials: None    Subjective: Seen and examined.  Sitting up in chair.  Symptomatically improved but not yet at baseline.  Remains in rapid atrial fibrillation.  Objective: Vitals:   04/07/24 0634 04/07/24 0635 04/07/24 0733 04/07/24 1112  BP:   (!) 140/91 130/87  Pulse:   (!) 108 (!) 111  Resp:      Temp:   98 F (36.7 C) 98.2 F (36.8 C)  TempSrc: Oral     SpO2:   99% 95%  Weight:  71.2 kg    Height:        Intake/Output Summary (Last 24 hours) at 04/07/2024 1410 Last data filed at 04/07/2024 0528 Gross per 24 hour  Intake 313.05 ml  Output --  Net 313.05 ml   Filed Weights   04/07/24 0635  Weight: 71.2 kg    Examination:  General exam: NAD Respiratory system: Lungs clear.  Normal work of breathing.  Room air Cardiovascular system: S1-2, tachycardic, irregular rhythm, no murmurs, no pedal edema Gastrointestinal system: Soft, NT/ND, normal bowel sounds Central nervous system: Alert and oriented. No focal neurological deficits. Extremities: Symmetric 5 x 5 power. Skin: No rashes, lesions or ulcers Psychiatry: Judgement and insight appear normal. Mood & affect appropriate.     Data Reviewed: I have personally reviewed following labs and imaging  studies  CBC: Recent Labs  Lab 04/03/24 1354 04/05/24 2059 04/06/24 0711  WBC 6.7 10.1 9.2  NEUTROABS  --  8.1*  --   HGB 12.3 12.5 12.6  HCT 37.1 37.4 37.3  MCV 98.1 97.4 97.9  PLT 189 204 187   Basic Metabolic Panel: Recent Labs  Lab 04/03/24 1354 04/05/24 2059 04/06/24 0711  NA 135 136 135  K 4.1 3.8 3.6  CL 104 102 103  CO2 22 23 23   GLUCOSE 111* 123* 148*   BUN 15 14 10   CREATININE 0.63 0.67 0.64  CALCIUM 8.5* 8.8* 8.7*  MG 2.1  --   --    GFR: Estimated Creatinine Clearance: 46.8 mL/min (by C-G formula based on SCr of 0.64 mg/dL). Liver Function Tests: Recent Labs  Lab 04/03/24 1354 04/05/24 2059  AST 35 35  ALT 27 33  ALKPHOS 84 97  BILITOT 0.7 1.3*  PROT 6.2* 6.7  ALBUMIN 3.7 3.9   No results for input(s): LIPASE, AMYLASE in the last 168 hours. No results for input(s): AMMONIA in the last 168 hours. Coagulation Profile: Recent Labs  Lab 04/05/24 2059  INR 2.5*   Cardiac Enzymes: No results for input(s): CKTOTAL, CKMB, CKMBINDEX, TROPONINI in the last 168 hours. BNP (last 3 results) No results for input(s): PROBNP in the last 8760 hours. HbA1C: No results for input(s): HGBA1C in the last 72 hours. CBG: No results for input(s): GLUCAP in the last 168 hours. Lipid Profile: No results for input(s): CHOL, HDL, LDLCALC, TRIG, CHOLHDL, LDLDIRECT in the last 72 hours. Thyroid  Function Tests: Recent Labs    04/06/24 0711  TSH 1.600  FREET4 1.00   Anemia Panel: No results for input(s): VITAMINB12, FOLATE, FERRITIN, TIBC, IRON, RETICCTPCT in the last 72 hours. Sepsis Labs: Recent Labs  Lab 04/05/24 2059 04/06/24 0711  PROCALCITON  --  <0.10  LATICACIDVEN 1.2  --     Recent Results (from the past 240 hours)  Blood Culture (routine x 2)     Status: Abnormal (Preliminary result)   Collection Time: 04/05/24  8:59 PM   Specimen: BLOOD RIGHT ARM  Result Value Ref Range Status   Specimen Description   Final    BLOOD RIGHT ARM Performed at [facility], [address], [place], [place] [address]    Special Requests   Final    BOTTLES DRAWN AEROBIC AND ANAEROBIC Blood Culture results may not be optimal due to an inadequate volume of blood received in culture bottles Performed at [facility], [address], [place], [place] [address]    Culture  Setup  Time GRAM POSITIVE COCCI ANAEROBIC BOTTLE ONLY   Final   Culture (A)  Final    STAPHYLOCOCCUS EPIDERMIDIS THE SIGNIFICANCE OF ISOLATING THIS ORGANISM FROM A SINGLE SET OF BLOOD CULTURES WHEN MULTIPLE SETS ARE DRAWN IS UNCERTAIN. PLEASE NOTIFY THE MICROBIOLOGY DEPARTMENT WITHIN ONE WEEK IF SPECIATION AND SENSITIVITIES ARE REQUIRED. Performed at [facility], [address] [place], [place] [address]    Report Status PENDING  Incomplete  Blood Culture (routine x 2)     Status: None (Preliminary result)   Collection Time: 04/05/24  8:59 PM   Specimen: BLOOD LEFT ARM  Result Value Ref Range Status   Specimen Description BLOOD LEFT ARM  Final   Special Requests   Final    BOTTLES DRAWN AEROBIC AND ANAEROBIC Blood Culture adequate volume   Culture   Final    NO GROWTH 2 DAYS Performed at  [facility], [address] [place], [place] [address]    Report Status PENDING  Incomplete  Urine Culture     Status: Abnormal (Preliminary result)   Collection Time: 04/05/24  8:59 PM   Specimen: Urine, Random  Result Value Ref Range Status   Specimen Description   Final    URINE, RANDOM Performed at [facility], [address] [place], [place] [address]    Special Requests   Final    NONE Reflexed from [phone] Performed at [facility], [address] [place], [place] [address]    Culture (A)  Final    70,000 COLONIES/mL ESCHERICHIA COLI SUSCEPTIBILITIES TO FOLLOW Performed at [facility], [address] [place], [place] [address]    Report Status PENDING  Incomplete  Blood Culture ID Panel (Reflexed)     Status: Abnormal   Collection Time: 04/05/24  8:59 PM  Result Value Ref Range Status   Enterococcus faecalis NOT DETECTED NOT DETECTED Final   Enterococcus Faecium NOT DETECTED NOT DETECTED Final   Listeria monocytogenes NOT DETECTED NOT DETECTED Final   Staphylococcus species DETECTED (A) NOT DETECTED Final    Comment: RESULT CALLED TO,  READ BACK BY AND VERIFIED WITH: ALEX CHAPPELL PHARM.D 04/06/24 1511 KG    Staphylococcus aureus (BCID) NOT DETECTED NOT DETECTED Final   Staphylococcus epidermidis DETECTED (A) NOT DETECTED Final    Comment: Methicillin (oxacillin) resistant coagulase negative staphylococcus. Possible blood culture contaminant (unless isolated from more than one blood culture draw or clinical case suggests pathogenicity). No antibiotic treatment is indicated for blood  culture contaminants. RESULT CALLED TO, READ BACK BY AND VERIFIED WITH: ALEX CHAPPELL PHARM.D 04/06/24 1511 KG    Staphylococcus lugdunensis NOT DETECTED NOT DETECTED Final   Streptococcus species NOT DETECTED NOT DETECTED Final   Streptococcus agalactiae NOT DETECTED NOT DETECTED Final   Streptococcus pneumoniae NOT DETECTED NOT DETECTED Final   Streptococcus pyogenes NOT DETECTED NOT DETECTED Final   A.calcoaceticus-baumannii NOT DETECTED NOT DETECTED Final   Bacteroides fragilis NOT DETECTED NOT DETECTED Final   Enterobacterales NOT DETECTED NOT DETECTED Final   Enterobacter cloacae complex NOT DETECTED NOT DETECTED Final   Escherichia coli NOT DETECTED NOT DETECTED Final   Klebsiella aerogenes NOT DETECTED NOT DETECTED Final   Klebsiella oxytoca NOT DETECTED NOT DETECTED Final   Klebsiella pneumoniae NOT DETECTED NOT DETECTED Final   Proteus species NOT DETECTED NOT DETECTED Final   Salmonella species NOT DETECTED NOT DETECTED Final   Serratia marcescens NOT DETECTED NOT DETECTED Final   Haemophilus influenzae NOT DETECTED NOT DETECTED Final   Neisseria meningitidis NOT DETECTED NOT DETECTED Final   Pseudomonas aeruginosa NOT DETECTED NOT DETECTED Final   Stenotrophomonas maltophilia NOT DETECTED NOT DETECTED Final   Candida albicans NOT DETECTED NOT DETECTED Final   Candida auris NOT DETECTED NOT DETECTED Final   Candida glabrata NOT DETECTED NOT DETECTED Final   Candida krusei NOT DETECTED NOT DETECTED Final   Candida parapsilosis  NOT DETECTED NOT DETECTED Final   Candida tropicalis NOT DETECTED NOT DETECTED Final   Cryptococcus neoformans/gattii NOT DETECTED NOT DETECTED Final   Methicillin resistance mecA/C DETECTED (A) NOT DETECTED Final    Comment: RESULT CALLED TO, READ BACK BY AND VERIFIED WITH: ALEX CHAPPELL PHARM.D 04/06/24 1511 KG Performed at [facility], [address] [place], [place] [address]   Respiratory (~20 pathogens) panel by PCR     Status: None   Collection Time: 04/05/24 11:26 PM  Specimen: Anterior Nasal Swab; Respiratory  Result Value Ref Range Status   Adenovirus NOT DETECTED NOT DETECTED Final   Coronavirus 229E NOT DETECTED NOT DETECTED Final    Comment: (NOTE) The Coronavirus on the Respiratory Panel, DOES NOT test for the novel  Coronavirus (2019 nCoV)    Coronavirus HKU1 NOT DETECTED NOT DETECTED Final   Coronavirus NL63 NOT DETECTED NOT DETECTED Final   Coronavirus OC43 NOT DETECTED NOT DETECTED Final   Metapneumovirus NOT DETECTED NOT DETECTED Final   Rhinovirus / Enterovirus NOT DETECTED NOT DETECTED Final   Influenza A NOT DETECTED NOT DETECTED Final   Influenza B NOT DETECTED NOT DETECTED Final   Parainfluenza Virus 1 NOT DETECTED NOT DETECTED Final   Parainfluenza Virus 2 NOT DETECTED NOT DETECTED Final   Parainfluenza Virus 3 NOT DETECTED NOT DETECTED Final   Parainfluenza Virus 4 NOT DETECTED NOT DETECTED Final   Respiratory Syncytial Virus NOT DETECTED NOT DETECTED Final   Bordetella pertussis NOT DETECTED NOT DETECTED Final   Bordetella Parapertussis NOT DETECTED NOT DETECTED Final   Chlamydophila pneumoniae NOT DETECTED NOT DETECTED Final   Mycoplasma pneumoniae NOT DETECTED NOT DETECTED Final    Comment: Performed at [facility], [address] [place], [place] [address]  SARS Coronavirus 2 by RT PCR (hospital order, performed in [facility] hospital lab) *cepheid single result test* Anterior Nasal Swab     Status: None   Collection Time:  04/05/24 11:26 PM   Specimen: Anterior Nasal Swab  Result Value Ref Range Status   SARS Coronavirus 2 by RT PCR NEGATIVE NEGATIVE Final    Comment: (NOTE) SARS-CoV-2 target nucleic acids are NOT DETECTED.  The SARS-CoV-2 RNA is generally detectable in upper and lower respiratory specimens during the acute phase of infection. The lowest concentration of SARS-CoV-2 viral copies this assay can detect is 250 copies / mL. A negative result does not preclude SARS-CoV-2 infection and should not be used as the sole basis for treatment or other patient management decisions.  A negative result may occur with improper specimen collection / handling, submission of specimen other than nasopharyngeal swab, presence of viral mutation(s) within the areas targeted by this assay, and inadequate number of viral copies (<250 copies / mL). A negative result must be combined with clinical observations, patient history, and epidemiological information.  Fact Sheet for Patients:   RoadLapTop.co.za  Fact Sheet for Healthcare Providers: http://kim-miller.com/  This test is not yet approved or  cleared by the [place]  FDA and has been authorized for detection and/or diagnosis of SARS-CoV-2 by FDA under an Emergency Use Authorization (EUA).  This EUA will remain in effect (meaning this test can be used) for the duration of the COVID-19 declaration under Section 564(b)(1) of the Act, 21 U.S.C. section 360bbb-3(b)(1), unless the authorization is terminated or revoked sooner.  Performed at [facility], [address] [place], [place] [address]          Radiology Studies: [facility] 1 View Result Date: 04/05/2024 CLINICAL DATA:  Questionable sepsis - evaluate for abnormality EXAM: PORTABLE CHEST - 1 VIEW COMPARISON:  April 03, 2024 FINDINGS: Bibasilar atelectasis. No pneumothorax. Small bilateral pleural effusions. Mild cardiomegaly. Tortuous  aorta with aortic atherosclerosis. No acute fracture or destructive lesion. Unchanged focal scoliosis of the upper thoracic spine. IMPRESSION: Small bilateral pleural effusions with bibasilar atelectasis. Electronically Signed   By: Rogelia Myers M.D.   On: 04/05/2024 20:35        Scheduled Meds:  benzonatate  200 mg Oral TID   cholecalciferol  1,000 Units Oral Daily   guaiFENesin  600 mg Oral BID   metoprolol succinate  25 mg Oral Once   [START ON 04/08/2024] metoprolol succinate  75 mg Oral Daily   multivitamin  1 tablet Oral Daily   [START ON 04/08/2024] predniSONE  20 mg Oral Q breakfast   rivaroxaban   20 mg Oral Q supper   Continuous Infusions:     LOS: 1 day    Calvin KATHEE Robson, MD [facility]   If 7PM-7AM, please contact night-coverage  04/07/2024, 2:10 PM

## 2024-04-07 NOTE — Care Management Important Message (Signed)
 Important Message  Patient Details  Name: Amy Shah MRN: [id number] Date of Birth: 06-Aug-1935   Important Message Given:  Yes - Medicare IM     [name] 04/07/2024, 12:40 PM

## 2024-04-08 DIAGNOSIS — I10 Essential (primary) hypertension: Secondary | ICD-10-CM | POA: Diagnosis not present

## 2024-04-08 DIAGNOSIS — E663 Overweight: Secondary | ICD-10-CM | POA: Diagnosis not present

## 2024-04-08 DIAGNOSIS — I4891 Unspecified atrial fibrillation: Secondary | ICD-10-CM | POA: Diagnosis not present

## 2024-04-08 DIAGNOSIS — R059 Cough, unspecified: Secondary | ICD-10-CM | POA: Diagnosis not present

## 2024-04-08 LAB — CULTURE, BLOOD (ROUTINE X 2)

## 2024-04-08 LAB — URINE CULTURE: Culture: 70000 — AB

## 2024-04-08 MED ORDER — TRAZODONE HCL 50 MG PO TABS
25.0000 mg | ORAL_TABLET | Freq: Every day | ORAL | Status: DC
  Administered 2024-04-08 – 2024-04-10 (×3): 25 mg via ORAL
  Filled 2024-04-08 (×3): qty 1

## 2024-04-08 MED ORDER — METOPROLOL SUCCINATE ER 100 MG PO TB24
100.0000 mg | ORAL_TABLET | Freq: Every day | ORAL | Status: DC
  Administered 2024-04-09 – 2024-04-11 (×3): 100 mg via ORAL
  Filled 2024-04-08 (×3): qty 1

## 2024-04-08 MED ORDER — DILTIAZEM HCL 25 MG/5ML IV SOLN
5.0000 mg | Freq: Once | INTRAVENOUS | Status: AC
  Administered 2024-04-08: 5 mg via INTRAVENOUS
  Filled 2024-04-08: qty 5

## 2024-04-08 MED ORDER — DILTIAZEM HCL 25 MG/5ML IV SOLN
5.0000 mg | Freq: Two times a day (BID) | INTRAVENOUS | Status: DC | PRN
  Administered 2024-04-09: 5 mg via INTRAVENOUS
  Filled 2024-04-08: qty 5

## 2024-04-08 NOTE — Plan of Care (Signed)

## 2024-04-08 NOTE — Progress Notes (Signed)
 PROGRESS NOTE    Amy Shah  FMW:[id number] DOB: March 11, 1935 DOA: 04/05/2024 PCP: [facility], [facility]    Brief Narrative:  89 y.o. female with medical history significant of hypertension, hyperlipidemia, A-fib on Xarelto , vertigo, who presents with weakness, subjective fever and cough.   Patient states that she has generalized weakness in the past several days.  States last night she developed fever of 101.3.  Her temperature is 99.7 in ED.  She also reports dry cough, mild SOB, no chest pain.  She states that she had sore throat few days ago which has resolved currently.  Patient has nausea, poor appetite, decreased oral intake, no vomiting, diarrhea or abdominal pain.  Patient denies dysuria or burning with urination.  She states that she has uterine prolapse, with chronic urinary frequency which has not changed.  7/2: Tachycardic at rest.  Stopped steroid and increased Toprol.  One-time dose of Cardizem  IV for rate control  Assessment & Plan:   Principal Problem:   Atrial fibrillation with RVR (HCC) Active Problems:   Cough   HTN (hypertension)   Overweight (BMI 25.0-29.9)   Atrial fibrillation with rapid ventricular response (HCC)   Atrial fibrillation with RVR (HCC):  Wean off Cardizem  gtt.  Rate rate improved but remains uncontrolled Plan: Increase metoprolol succinate  to 75 mg-> 100 mg daily.  Give one-time dose of IV Cardizem  5 mg for better rate control.  Added as needed Cardizem  Continue Xarelto  Continue telemetry Goal heart rate under 110   Cough:  Possibly due to viral infection.  Chest x-ray negative for infiltration.  No oxygen desaturation. Negative procalcitonin.  No indication of infection RVP and COVID-negative Has cough productive of sputum Plan: Twice daily Mucinex As needed bronchodilators Discontinue steroids as she is not able to sleep 3 times daily Tessalon Perles  HTN (hypertension) Increase dose of metoprolol succinate as above Hold  lisinopril for now.  Consider restarting at discharge   Subjective fever:   Afebrile in ED.  Possibly secondary to viral infection.  Procalcitonin negative.  No signs of bacterial infection.  No indication for antibiotics.  Continue to follow cultures  DVT prophylaxis: Xarelto  Code Status: DNR Family Communication:Daughter via phone 6/30.  None at bedside Disposition Plan: Status is: Inpatient Remains inpatient appropriate because: A-fib RVR.  Viral syndrome     Level of care: Telemetry Cardiac    Subjective: Seen and examined.  Sitting up in chair.  Could not sleep very well.  Remains in A-fib with RVR with rate up to 120-130s  Objective: Vitals:   04/08/24 1159 04/08/24 1400 04/08/24 1608 04/08/24 2009  BP: 127/82  (!) 126/92 (!) 116/98  Pulse: (!) 112 99 (!) 110 (!) 104  Resp:    18  Temp: 98.4 F (36.9 C)  98 F (36.7 C) 98.4 F (36.9 C)  TempSrc:      SpO2: 94%  95% 98%  Weight:      Height:        Intake/Output Summary (Last 24 hours) at 04/08/2024 2205 Last data filed at 04/08/2024 2006 Gross per 24 hour  Intake 880 ml  Output 751 ml  Net 129 ml   Filed Weights   04/07/24 0635  Weight: 71.2 kg    Examination:  General exam: NAD Respiratory system: Lungs clear.  Normal work of breathing.  Room air Cardiovascular system: S1-2, tachycardic, irregular rhythm, no murmurs, no pedal edema Gastrointestinal system: Soft, NT/ND, normal bowel sounds Central nervous system: Alert and oriented. No focal  neurological deficits. Extremities: Symmetric 5 x 5 power. Skin: No rashes, lesions or ulcers Psychiatry: Judgement and insight appear normal. Mood & affect appropriate.     Data Reviewed: I have personally reviewed following labs and imaging studies  CBC: Recent Labs  Lab 04/03/24 1354 04/05/24 2059 04/06/24 0711  WBC 6.7 10.1 9.2  NEUTROABS  --  8.1*  --   HGB 12.3 12.5 12.6  HCT 37.1 37.4 37.3  MCV 98.1 97.4 97.9  PLT 189 204 187   Basic Metabolic  Panel: Recent Labs  Lab 04/03/24 1354 04/05/24 2059 04/06/24 0711  NA 135 136 135  K 4.1 3.8 3.6  CL 104 102 103  CO2 22 23 23   GLUCOSE 111* 123* 148*  BUN 15 14 10   CREATININE 0.63 0.67 0.64  CALCIUM 8.5* 8.8* 8.7*  MG 2.1  --   --    GFR: Estimated Creatinine Clearance: 46.8 mL/min (by C-G formula based on SCr of 0.64 mg/dL). Liver Function Tests: Recent Labs  Lab 04/03/24 1354 04/05/24 2059  AST 35 35  ALT 27 33  ALKPHOS 84 97  BILITOT 0.7 1.3*  PROT 6.2* 6.7  ALBUMIN 3.7 3.9   No results for input(s): LIPASE, AMYLASE in the last 168 hours. No results for input(s): AMMONIA in the last 168 hours. Coagulation Profile: Recent Labs  Lab 04/05/24 2059  INR 2.5*    Thyroid  Function Tests: Recent Labs    04/06/24 0711  TSH 1.600  FREET4 1.00   Anemia Panel: No results for input(s): VITAMINB12, FOLATE, FERRITIN, TIBC, IRON, RETICCTPCT in the last 72 hours. Sepsis Labs: Recent Labs  Lab 04/05/24 2059 04/06/24 0711  PROCALCITON  --  <0.10  LATICACIDVEN 1.2  --     Recent Results (from the past 240 hours)  Blood Culture (routine x 2)     Status: Abnormal   Collection Time: 04/05/24  8:59 PM   Specimen: BLOOD RIGHT ARM  Result Value Ref Range Status   Specimen Description   Final    BLOOD RIGHT ARM Performed at [facility], [address] [place], [place] [address]    Special Requests   Final    BOTTLES DRAWN AEROBIC AND ANAEROBIC Blood Culture results may not be optimal due to an inadequate volume of blood received in culture bottles Performed at [facility], [address] [place], [place] [address]    Culture  Setup Time GRAM POSITIVE COCCI ANAEROBIC BOTTLE ONLY   Final   Culture (A)  Final    STAPHYLOCOCCUS EPIDERMIDIS THE SIGNIFICANCE OF ISOLATING THIS ORGANISM FROM A SINGLE SET OF BLOOD CULTURES WHEN MULTIPLE SETS ARE DRAWN IS UNCERTAIN. PLEASE NOTIFY THE MICROBIOLOGY DEPARTMENT WITHIN ONE WEEK IF  SPECIATION AND SENSITIVITIES ARE REQUIRED. Performed at [facility], [address] [place], [place] [address]    Report Status 04/08/2024 FINAL  Final  Blood Culture (routine x 2)     Status: None (Preliminary result)   Collection Time: 04/05/24  8:59 PM   Specimen: BLOOD LEFT ARM  Result Value Ref Range Status   Specimen Description BLOOD LEFT ARM  Final   Special Requests   Final    BOTTLES DRAWN AEROBIC AND ANAEROBIC Blood Culture adequate volume   Culture   Final    NO GROWTH 3 DAYS Performed at [facility], [address], [place], [place] [address]    Report Status PENDING  Incomplete  Urine Culture     Status: Abnormal   Collection  Time: 04/05/24  8:59 PM   Specimen: Urine, Random  Result Value Ref Range Status   Specimen Description   Final    URINE, RANDOM Performed at [facility], [address] [place], [place] [address]    Special Requests   Final    NONE Reflexed from [id number] Performed at [facility], [address] [place], [place] [address]    Culture 70,000 COLONIES/mL ESCHERICHIA COLI (A)  Final   Report Status 04/08/2024 FINAL  Final   Organism ID, Bacteria ESCHERICHIA COLI (A)  Final      Susceptibility   Escherichia coli - MIC*    AMPICILLIN 4 SENSITIVE Sensitive     CEFAZOLIN <=4 SENSITIVE Sensitive     CEFEPIME <=0.12 SENSITIVE Sensitive     CEFTRIAXONE <=0.25 SENSITIVE Sensitive     CIPROFLOXACIN <=0.25 SENSITIVE Sensitive     GENTAMICIN <=1 SENSITIVE Sensitive     IMIPENEM <=0.25 SENSITIVE Sensitive     NITROFURANTOIN <=16 SENSITIVE Sensitive     TRIMETH/SULFA <=20 SENSITIVE Sensitive     AMPICILLIN/SULBACTAM <=2 SENSITIVE Sensitive     PIP/TAZO <=4 SENSITIVE Sensitive ug/mL    * 70,000 COLONIES/mL ESCHERICHIA COLI  Blood Culture ID Panel (Reflexed)     Status: Abnormal   Collection Time: 04/05/24  8:59 PM  Result Value Ref Range Status   Enterococcus faecalis NOT DETECTED NOT DETECTED Final    Enterococcus Faecium NOT DETECTED NOT DETECTED Final   Listeria monocytogenes NOT DETECTED NOT DETECTED Final   Staphylococcus species DETECTED (A) NOT DETECTED Final    Comment: RESULT CALLED TO, READ BACK BY AND VERIFIED WITH: ALEX CHAPPELL PHARM.D 04/06/24 1511 KG    Staphylococcus aureus (BCID) NOT DETECTED NOT DETECTED Final   Staphylococcus epidermidis DETECTED (A) NOT DETECTED Final    Comment: Methicillin (oxacillin) resistant coagulase negative staphylococcus. Possible blood culture contaminant (unless isolated from more than one blood culture draw or clinical case suggests pathogenicity). No antibiotic treatment is indicated for blood  culture contaminants. RESULT CALLED TO, READ BACK BY AND VERIFIED WITH: ALEX CHAPPELL PHARM.D 04/06/24 1511 KG    Staphylococcus lugdunensis NOT DETECTED NOT DETECTED Final   Streptococcus species NOT DETECTED NOT DETECTED Final   Streptococcus agalactiae NOT DETECTED NOT DETECTED Final   Streptococcus pneumoniae NOT DETECTED NOT DETECTED Final   Streptococcus pyogenes NOT DETECTED NOT DETECTED Final   A.calcoaceticus-baumannii NOT DETECTED NOT DETECTED Final   Bacteroides fragilis NOT DETECTED NOT DETECTED Final   Enterobacterales NOT DETECTED NOT DETECTED Final   Enterobacter cloacae complex NOT DETECTED NOT DETECTED Final   Escherichia coli NOT DETECTED NOT DETECTED Final   Klebsiella aerogenes NOT DETECTED NOT DETECTED Final   Klebsiella oxytoca NOT DETECTED NOT DETECTED Final   Klebsiella pneumoniae NOT DETECTED NOT DETECTED Final   Proteus species NOT DETECTED NOT DETECTED Final   Salmonella species NOT DETECTED NOT DETECTED Final   Serratia marcescens NOT DETECTED NOT DETECTED Final   Haemophilus influenzae NOT DETECTED NOT DETECTED Final   Neisseria meningitidis NOT DETECTED NOT DETECTED Final   Pseudomonas aeruginosa NOT DETECTED NOT DETECTED Final   Stenotrophomonas maltophilia NOT DETECTED NOT DETECTED Final   Candida albicans NOT  DETECTED NOT DETECTED Final   Candida auris NOT DETECTED NOT DETECTED Final   Candida glabrata NOT DETECTED NOT DETECTED Final   Candida krusei NOT DETECTED NOT DETECTED Final   Candida parapsilosis NOT DETECTED NOT DETECTED Final   Candida tropicalis NOT DETECTED NOT DETECTED Final   Cryptococcus neoformans/gattii NOT DETECTED  NOT DETECTED Final   Methicillin resistance mecA/C DETECTED (A) NOT DETECTED Final    Comment: RESULT CALLED TO, READ BACK BY AND VERIFIED WITH: ALEX CHAPPELL PHARM.D 04/06/24 1511 KG Performed at [facility], [address] [place], [place] [address]   Respiratory (~20 pathogens) panel by PCR     Status: None   Collection Time: 04/05/24 11:26 PM   Specimen: Anterior Nasal Swab; Respiratory  Result Value Ref Range Status   Adenovirus NOT DETECTED NOT DETECTED Final   Coronavirus 229E NOT DETECTED NOT DETECTED Final    Comment: (NOTE) The Coronavirus on the Respiratory Panel, DOES NOT test for the novel  Coronavirus (2019 nCoV)    Coronavirus HKU1 NOT DETECTED NOT DETECTED Final   Coronavirus NL63 NOT DETECTED NOT DETECTED Final   Coronavirus OC43 NOT DETECTED NOT DETECTED Final   Metapneumovirus NOT DETECTED NOT DETECTED Final   Rhinovirus / Enterovirus NOT DETECTED NOT DETECTED Final   Influenza A NOT DETECTED NOT DETECTED Final   Influenza B NOT DETECTED NOT DETECTED Final   Parainfluenza Virus 1 NOT DETECTED NOT DETECTED Final   Parainfluenza Virus 2 NOT DETECTED NOT DETECTED Final   Parainfluenza Virus 3 NOT DETECTED NOT DETECTED Final   Parainfluenza Virus 4 NOT DETECTED NOT DETECTED Final   Respiratory Syncytial Virus NOT DETECTED NOT DETECTED Final   Bordetella pertussis NOT DETECTED NOT DETECTED Final   Bordetella Parapertussis NOT DETECTED NOT DETECTED Final   Chlamydophila pneumoniae NOT DETECTED NOT DETECTED Final   Mycoplasma pneumoniae NOT DETECTED NOT DETECTED Final    Comment: Performed at [facility], [address] [place], [place] [address]  SARS Coronavirus 2 by RT PCR (hospital order, performed in [facility] hospital lab) *cepheid single result test* Anterior Nasal Swab     Status: None   Collection Time: 04/05/24 11:26 PM   Specimen: Anterior Nasal Swab  Result Value Ref Range Status   SARS Coronavirus 2 by RT PCR NEGATIVE NEGATIVE Final    Comment: (NOTE) SARS-CoV-2 target nucleic acids are NOT DETECTED.  The SARS-CoV-2 RNA is generally detectable in upper and lower respiratory specimens during the acute phase of infection. The lowest concentration of SARS-CoV-2 viral copies this assay can detect is 250 copies / mL. A negative result does not preclude SARS-CoV-2 infection and should not be used as the sole basis for treatment or other patient management decisions.  A negative result may occur with improper specimen collection / handling, submission of specimen other than nasopharyngeal swab, presence of viral mutation(s) within the areas targeted by this assay, and inadequate number of viral copies (<250 copies / mL). A negative result must be combined with clinical observations, patient history, and epidemiological information.  Fact Sheet for Patients:   RoadLapTop.co.za  Fact Sheet for Healthcare Providers: http://kim-miller.com/  This test is not yet approved or  cleared by the [place]  FDA and has been authorized for detection and/or diagnosis of SARS-CoV-2 by FDA under an Emergency Use Authorization (EUA).  This EUA will remain in effect (meaning this test can be used) for the duration of the COVID-19 declaration under Section 564(b)(1) of the Act, 21 U.S.C. section 360bbb-3(b)(1), unless the authorization is terminated or revoked sooner.  Performed at [facility], [address], [place], [place] [address]          Radiology Studies: No results found.       Scheduled Meds:  benzonatate  200 mg Oral TID    cholecalciferol  1,000 Units Oral Daily   guaiFENesin  600 mg Oral BID   [START ON 04/09/2024] metoprolol succinate  100 mg Oral Daily   multivitamin  1 tablet Oral Daily   rivaroxaban   20 mg Oral Q supper   traZODone   25 mg Oral QHS   Continuous Infusions:  Time spent 35 minutes   LOS: 2 days    Cresencio Fairly, MD [facility]   If 7PM-7AM, please contact night-coverage  04/08/2024, 10:05 PM

## 2024-04-08 NOTE — TOC Initial Note (Signed)
 Transition of Care [facility]) - Initial/Assessment Note    Patient Details  Name: Amy Shah MRN: [id number] Date of Birth: 03-04-1935  [facility] CM/SW Contact:    Kenyon Eichelberger C Shantoya Geurts, RN Phone Number: 04/08/2024, 4:07 PM  Clinical Narrative:                 Patient admitted from [facility].         Patient Goals and CMS Choice            Expected Discharge Plan and Services                                              Prior Living Arrangements/Services                       Activities of Daily Living   ADL Screening (condition at time of admission) Independently performs ADLs?: No Does the patient have a NEW difficulty with bathing/dressing/toileting/self-feeding that is expected to last >3 days?: No Does the patient have a NEW difficulty with getting in/out of bed, walking, or climbing stairs that is expected to last >3 days?: No Does the patient have a NEW difficulty with communication that is expected to last >3 days?: No Is the patient deaf or have difficulty hearing?: No Does the patient have difficulty seeing, even when wearing glasses/contacts?: No Does the patient have difficulty concentrating, remembering, or making decisions?: No  Permission Sought/Granted                  Emotional Assessment              Admission diagnosis:  Atrial fibrillation with rapid ventricular response (HCC) [I48.91] Atrial fibrillation with RVR (HCC) [I48.91] Patient Active Problem List   Diagnosis Date Noted   Atrial fibrillation with rapid ventricular response (HCC) 04/06/2024   Atrial fibrillation with RVR (HCC) 04/05/2024   HTN (hypertension) 04/05/2024   Cough 04/05/2024   Overweight (BMI 25.0-29.9) 04/05/2024   History of partial hysterectomy 12/22/2020   Osteoarthritis of hip 08/28/2018   Degeneration of lumbar intervertebral disc 10/31/2017   History of total hip arthroplasty 10/31/2017   Sacroiliac joint pain  10/31/2017   Scoliosis of lumbar spine 10/31/2017   Risk for falls 03/22/2017   PCP:  [facility], [facility]:   [id number] [place], [place] - [address] AT [facility] [address] [place] [address] [place] [address] Phone: [phone] Fax: [phone]     Social Drivers of Health (SDOH) Social History: SDOH Screenings   Food Insecurity: No Food Insecurity (04/06/2024)  Housing: Low Risk  (04/07/2024)  Transportation Needs: No Transportation Needs (04/06/2024)  Utilities: Not At Risk (04/06/2024)  Alcohol Screen: Low Risk  (08/08/2022)  Depression (PHQ2-9): Low Risk  (08/08/2022)  Financial Resource Strain: Low Risk  (08/08/2022)  Physical Activity: Inactive (08/08/2022)  Social Connections: Socially Isolated (04/07/2024)  Stress: No Stress Concern Present (08/08/2022)  Tobacco Use: Low Risk  (04/05/2024)   SDOH Interventions:     Readmission Risk Interventions     No data to display

## 2024-04-09 ENCOUNTER — Inpatient Hospital Stay (HOSPITAL_COMMUNITY)
Admit: 2024-04-09 | Discharge: 2024-04-09 | Disposition: A | Discharge status: Home / Self Care | Attending: Internal Medicine | Admitting: Internal Medicine

## 2024-04-09 ENCOUNTER — Inpatient Hospital Stay: Admit: 2024-04-09

## 2024-04-09 DIAGNOSIS — I5031 Acute diastolic (congestive) heart failure: Secondary | ICD-10-CM | POA: Diagnosis not present

## 2024-04-09 DIAGNOSIS — R0609 Other forms of dyspnea: Secondary | ICD-10-CM

## 2024-04-09 DIAGNOSIS — I4891 Unspecified atrial fibrillation: Secondary | ICD-10-CM | POA: Diagnosis not present

## 2024-04-09 DIAGNOSIS — R051 Acute cough: Secondary | ICD-10-CM

## 2024-04-09 LAB — ECHOCARDIOGRAM COMPLETE
AR max vel: 3.25 cm2
AV Area VTI: 3.39 cm2
AV Area mean vel: 3.27 cm2
AV Mean grad: 2 mmHg
AV Peak grad: 2.9 mmHg
Ao pk vel: 0.85 m/s
Area-P 1/2: 4.15 cm2
Height: 63 in
S' Lateral: 2.4 cm
Weight: 2511.48 [oz_av]

## 2024-04-09 LAB — CBC
HCT: 39.4 % (ref 36.0–46.0)
Hemoglobin: 13 g/dL (ref 12.0–15.0)
MCH: 31.6 pg (ref 26.0–34.0)
MCHC: 33 g/dL (ref 30.0–36.0)
MCV: 95.9 fL (ref 80.0–100.0)
Platelets: 253 10*3/uL (ref 150–400)
RBC: 4.11 MIL/uL (ref 3.87–5.11)
RDW: 12 % (ref 11.5–15.5)
WBC: 9.1 10*3/uL (ref 4.0–10.5)
nRBC: 0 % (ref 0.0–0.2)

## 2024-04-09 LAB — BASIC METABOLIC PANEL WITH GFR
Anion gap: 7 (ref 5–15)
BUN: 21 mg/dL (ref 8–23)
CO2: 26 mmol/L (ref 22–32)
Calcium: 8.9 mg/dL (ref 8.9–10.3)
Chloride: 103 mmol/L (ref 98–111)
Creatinine, Ser: 0.66 mg/dL (ref 0.44–1.00)
GFR, Estimated: >60 mL/min (ref >60)
Glucose, Bld: 96 mg/dL (ref 70–99)
Potassium: 3.9 mmol/L (ref 3.5–5.1)
Sodium: 136 mmol/L (ref 135–145)

## 2024-04-09 LAB — BRAIN NATRIURETIC PEPTIDE: B Natriuretic Peptide: 321.2 pg/mL — ABNORMAL HIGH (ref 0.0–100.0)

## 2024-04-09 MED ORDER — FUROSEMIDE 10 MG/ML IJ SOLN
20.0000 mg | Freq: Two times a day (BID) | INTRAMUSCULAR | Status: DC
  Administered 2024-04-09 – 2024-04-10 (×4): 20 mg via INTRAVENOUS
  Filled 2024-04-09 (×4): qty 2

## 2024-04-09 MED ORDER — DIGOXIN 0.25 MG/ML IJ SOLN
0.2500 mg | Freq: Once | INTRAMUSCULAR | Status: AC
  Administered 2024-04-09: 0.25 mg via INTRAVENOUS
  Filled 2024-04-09: qty 2

## 2024-04-09 MED ORDER — MENTHOL 3 MG MT LOZG
1.0000 | LOZENGE | OROMUCOSAL | Status: DC | PRN
  Administered 2024-04-09 (×2): 3 mg via ORAL
  Filled 2024-04-09: qty 9

## 2024-04-09 MED ORDER — DILTIAZEM HCL 30 MG PO TABS
60.0000 mg | ORAL_TABLET | Freq: Four times a day (QID) | ORAL | Status: DC
  Administered 2024-04-09 – 2024-04-10 (×4): 60 mg via ORAL
  Filled 2024-04-09 (×4): qty 2

## 2024-04-09 NOTE — Progress Notes (Signed)
 PROGRESS NOTE    Amy Shah  FMW:[id number] DOB: [birth year] DOA: 04/05/2024 PCP: [facility]    Brief Narrative:  89 y.o. female with medical history significant of hypertension, hyperlipidemia, A-fib on Xarelto , vertigo, who presents with weakness, subjective fever and cough.   Patient states that she has generalized weakness in the past several days.  States last night she developed fever of 101.3.  Her temperature is 99.7 in ED.  She also reports dry cough, mild SOB, no chest pain.  She states that she had sore throat few days ago which has resolved currently.  Patient has nausea, poor appetite, decreased oral intake, no vomiting, diarrhea or abdominal pain.  Patient denies dysuria or burning with urination.  She states that she has uterine prolapse, with chronic urinary frequency which has not changed.  7/2: Tachycardic at rest.  Stopped steroid and increased Toprol.  One-time dose of Cardizem  IV for rate control 7/3: Cardio c/s, cardizem  60 mg Q 6 hrs and digoxin once, Lasix 20 mg bid  Assessment & Plan:   Principal Problem:   Atrial fibrillation with RVR (HCC) Active Problems:   Cough   HTN (hypertension)   Overweight (BMI 25.0-29.9)   Atrial fibrillation with rapid ventricular response (HCC)   Acute diastolic CHF (congestive heart failure) (HCC)   Atrial fibrillation with RVR (HCC): likely Diastolic dysfunction CXR shows b/l pleural effusion, started Lasix 20 mg iv bid per cardio - Cardio c/s - appreciate input Cardizem  60 mg Q 6 hrs and Digoxin once. - continue metoprolol succinate 100 mg daily.   - Continue Xarelto  - Echo shows normal LV function   Acute Bronchitis/cough Possibly due to viral infection.  Chest x-ray negative for infiltration.  No oxygen desaturation. Negative procalcitonin.  No indication of infection RVP and COVID-negative Twice daily Mucinex As needed bronchodilators Stopped steroids. Added cepacol lozenges 3 times daily  Tessalon Perles not helping much per patient  HTN (hypertension) Continue metoprolol succinate as above Hold lisinopril for now.  Consider restarting at discharge   Subjective fever:   Afebrile in ED.  Possibly secondary to viral infection.  Procalcitonin negative.  No signs of bacterial infection.  No indication for antibiotics.  Continue to follow cultures  DVT prophylaxis: Xarelto  Code Status: DNR Family Communication:Daughter via phone 6/30.  None at bedside Disposition Plan: Status is: Inpatient Remains inpatient appropriate because: A-fib RVR.  Viral syndrome     Level of care: Telemetry Cardiac    Subjective: Seen and examined. HR remains high and she reports palpitation and some sob  Objective: Vitals:   04/09/24 0634 04/09/24 0815 04/09/24 1201 04/09/24 1532  BP:  103/86 117/78 118/81  Pulse:  (!) 140 84 95  Resp:      Temp:  98.3 F (36.8 C) 98.4 F (36.9 C) 97.8 F (36.6 C)  TempSrc:      SpO2: 95% 93% 94% 95%  Weight:      Height:        Intake/Output Summary (Last 24 hours) at 04/09/2024 1956 Last data filed at 04/09/2024 1946 Gross per 24 hour  Intake 600 ml  Output 950 ml  Net -350 ml   Filed Weights   04/07/24 0635  Weight: 71.2 kg    Examination:  General exam: NAD Respiratory system: Lungs clear.  Normal work of breathing.  Room air Cardiovascular system: S1-2, tachycardic, irregular rhythm, no murmurs, no pedal edema Gastrointestinal system: Soft, NT/ND, normal bowel sounds Central nervous system: Alert and oriented. No  focal neurological deficits. Extremities: Symmetric 5 x 5 power. Skin: No rashes, lesions or ulcers Psychiatry: Judgement and insight appear normal. Mood & affect appropriate.     Data Reviewed: I have personally reviewed following labs and imaging studies  CBC: Recent Labs  Lab 04/03/24 1354 04/05/24 2059 04/06/24 0711 04/09/24 0345  WBC 6.7 10.1 9.2 9.1  NEUTROABS  --  8.1*  --   --   HGB 12.3 12.5 12.6 13.0   HCT 37.1 37.4 37.3 39.4  MCV 98.1 97.4 97.9 95.9  PLT 189 204 187 253   Basic Metabolic Panel: Recent Labs  Lab 04/03/24 1354 04/05/24 2059 04/06/24 0711 04/09/24 0345  NA 135 136 135 136  K 4.1 3.8 3.6 3.9  CL 104 102 103 103  CO2 22 23 23 26   GLUCOSE 111* 123* 148* 96  BUN 15 14 10 21   CREATININE 0.63 0.67 0.64 0.66  CALCIUM 8.5* 8.8* 8.7* 8.9  MG 2.1  --   --   --    GFR: Estimated Creatinine Clearance: 46.8 mL/min (by C-G formula based on SCr of 0.66 mg/dL). Liver Function Tests: Recent Labs  Lab 04/03/24 1354 04/05/24 2059  AST 35 35  ALT 27 33  ALKPHOS 84 97  BILITOT 0.7 1.3*  PROT 6.2* 6.7  ALBUMIN 3.7 3.9   No results for input(s): LIPASE, AMYLASE in the last 168 hours. No results for input(s): AMMONIA in the last 168 hours. Coagulation Profile: Recent Labs  Lab 04/05/24 2059  INR 2.5*    Thyroid  Function Tests: No results for input(s): TSH, T4TOTAL, FREET4, T3FREE, THYROIDAB in the last 72 hours.  Anemia Panel: No results for input(s): VITAMINB12, FOLATE, FERRITIN, TIBC, IRON, RETICCTPCT in the last 72 hours. Sepsis Labs: Recent Labs  Lab 04/05/24 2059 04/06/24 0711  PROCALCITON  --  <0.10  LATICACIDVEN 1.2  --     Recent Results (from the past 240 hours)  Blood Culture (routine x 2)     Status: Abnormal   Collection Time: 04/05/24  8:59 PM   Specimen: BLOOD RIGHT ARM  Result Value Ref Range Status   Specimen Description   Final    BLOOD RIGHT ARM Performed at [facility], [address] [place], [place] [address]    Special Requests   Final    BOTTLES DRAWN AEROBIC AND ANAEROBIC Blood Culture results may not be optimal due to an inadequate volume of blood received in culture bottles Performed at [facility], [address], [place], [place] [address]    Culture  Setup Time GRAM POSITIVE COCCI ANAEROBIC BOTTLE ONLY   Final   Culture (A)  Final    STAPHYLOCOCCUS EPIDERMIDIS THE  SIGNIFICANCE OF ISOLATING THIS ORGANISM FROM A SINGLE SET OF BLOOD CULTURES WHEN MULTIPLE SETS ARE DRAWN IS UNCERTAIN. PLEASE NOTIFY THE MICROBIOLOGY DEPARTMENT WITHIN ONE WEEK IF SPECIATION AND SENSITIVITIES ARE REQUIRED. Performed at [facility], [address] [place], [place] [address]    Report Status 04/08/2024 FINAL  Final  Blood Culture (routine x 2)     Status: None (Preliminary result)   Collection Time: 04/05/24  8:59 PM   Specimen: BLOOD LEFT ARM  Result Value Ref Range Status   Specimen Description BLOOD LEFT ARM  Final   Special Requests   Final    BOTTLES DRAWN AEROBIC AND ANAEROBIC Blood Culture adequate volume   Culture   Final    NO GROWTH 4 DAYS Performed at [facility], [address] [place], [place] [address]    Report Status PENDING  Incomplete  Urine Culture     Status: Abnormal   Collection Time: 04/05/24  8:59 PM   Specimen: Urine, Random  Result Value Ref Range Status   Specimen Description   Final    URINE, RANDOM Performed at [facility], [address] [place], [place] [address]    Special Requests   Final    NONE Reflexed from [id number] Performed at [facility], [address] [place], [place] [address]    Culture 70,000 COLONIES/mL ESCHERICHIA COLI (A)  Final   Report Status 04/08/2024 FINAL  Final   Organism ID, Bacteria ESCHERICHIA COLI (A)  Final      Susceptibility   Escherichia coli - MIC*    AMPICILLIN 4 SENSITIVE Sensitive     CEFAZOLIN <=4 SENSITIVE Sensitive     CEFEPIME <=0.12 SENSITIVE Sensitive     CEFTRIAXONE <=0.25 SENSITIVE Sensitive     CIPROFLOXACIN <=0.25 SENSITIVE Sensitive     GENTAMICIN <=1 SENSITIVE Sensitive     IMIPENEM <=0.25 SENSITIVE Sensitive     NITROFURANTOIN <=16 SENSITIVE Sensitive     TRIMETH/SULFA <=20 SENSITIVE Sensitive     AMPICILLIN/SULBACTAM <=2 SENSITIVE Sensitive     PIP/TAZO <=4 SENSITIVE Sensitive ug/mL    * 70,000 COLONIES/mL ESCHERICHIA COLI  Blood Culture  ID Panel (Reflexed)     Status: Abnormal   Collection Time: 04/05/24  8:59 PM  Result Value Ref Range Status   Enterococcus faecalis NOT DETECTED NOT DETECTED Final   Enterococcus Faecium NOT DETECTED NOT DETECTED Final   Listeria monocytogenes NOT DETECTED NOT DETECTED Final   Staphylococcus species DETECTED (A) NOT DETECTED Final    Comment: RESULT CALLED TO, READ BACK BY AND VERIFIED WITH: ALEX CHAPPELL PHARM.D 04/06/24 1511 KG    Staphylococcus aureus (BCID) NOT DETECTED NOT DETECTED Final   Staphylococcus epidermidis DETECTED (A) NOT DETECTED Final    Comment: Methicillin (oxacillin) resistant coagulase negative staphylococcus. Possible blood culture contaminant (unless isolated from more than one blood culture draw or clinical case suggests pathogenicity). No antibiotic treatment is indicated for blood  culture contaminants. RESULT CALLED TO, READ BACK BY AND VERIFIED WITH: ALEX CHAPPELL PHARM.D 04/06/24 1511 KG    Staphylococcus lugdunensis NOT DETECTED NOT DETECTED Final   Streptococcus species NOT DETECTED NOT DETECTED Final   Streptococcus agalactiae NOT DETECTED NOT DETECTED Final   Streptococcus pneumoniae NOT DETECTED NOT DETECTED Final   Streptococcus pyogenes NOT DETECTED NOT DETECTED Final   A.calcoaceticus-baumannii NOT DETECTED NOT DETECTED Final   Bacteroides fragilis NOT DETECTED NOT DETECTED Final   Enterobacterales NOT DETECTED NOT DETECTED Final   Enterobacter cloacae complex NOT DETECTED NOT DETECTED Final   Escherichia coli NOT DETECTED NOT DETECTED Final   Klebsiella aerogenes NOT DETECTED NOT DETECTED Final   Klebsiella oxytoca NOT DETECTED NOT DETECTED Final   Klebsiella pneumoniae NOT DETECTED NOT DETECTED Final   Proteus species NOT DETECTED NOT DETECTED Final   Salmonella species NOT DETECTED NOT DETECTED Final   Serratia marcescens NOT DETECTED NOT DETECTED Final   Haemophilus influenzae NOT DETECTED NOT DETECTED Final   Neisseria meningitidis NOT  DETECTED NOT DETECTED Final   Pseudomonas aeruginosa NOT DETECTED NOT DETECTED Final   Stenotrophomonas maltophilia NOT DETECTED NOT DETECTED Final   Candida albicans NOT DETECTED NOT DETECTED Final   Candida auris NOT DETECTED NOT DETECTED Final   Candida glabrata NOT DETECTED NOT DETECTED Final   Candida krusei NOT DETECTED NOT DETECTED Final  Candida parapsilosis NOT DETECTED NOT DETECTED Final   Candida tropicalis NOT DETECTED NOT DETECTED Final   Cryptococcus neoformans/gattii NOT DETECTED NOT DETECTED Final   Methicillin resistance mecA/C DETECTED (A) NOT DETECTED Final    Comment: RESULT CALLED TO, READ BACK BY AND VERIFIED WITH: ALEX CHAPPELL PHARM.D 04/06/24 1511 KG Performed at [facility], [address] [place], [place] [address]   Respiratory (~20 pathogens) panel by PCR     Status: None   Collection Time: 04/05/24 11:26 PM   Specimen: Anterior Nasal Swab; Respiratory  Result Value Ref Range Status   Adenovirus NOT DETECTED NOT DETECTED Final   Coronavirus 229E NOT DETECTED NOT DETECTED Final    Comment: (NOTE) The Coronavirus on the Respiratory Panel, DOES NOT test for the novel  Coronavirus (2019 nCoV)    Coronavirus HKU1 NOT DETECTED NOT DETECTED Final   Coronavirus NL63 NOT DETECTED NOT DETECTED Final   Coronavirus OC43 NOT DETECTED NOT DETECTED Final   Metapneumovirus NOT DETECTED NOT DETECTED Final   Rhinovirus / Enterovirus NOT DETECTED NOT DETECTED Final   Influenza A NOT DETECTED NOT DETECTED Final   Influenza B NOT DETECTED NOT DETECTED Final   Parainfluenza Virus 1 NOT DETECTED NOT DETECTED Final   Parainfluenza Virus 2 NOT DETECTED NOT DETECTED Final   Parainfluenza Virus 3 NOT DETECTED NOT DETECTED Final   Parainfluenza Virus 4 NOT DETECTED NOT DETECTED Final   Respiratory Syncytial Virus NOT DETECTED NOT DETECTED Final   Bordetella pertussis NOT DETECTED NOT DETECTED Final   Bordetella Parapertussis NOT DETECTED NOT DETECTED Final    Chlamydophila pneumoniae NOT DETECTED NOT DETECTED Final   Mycoplasma pneumoniae NOT DETECTED NOT DETECTED Final    Comment: Performed at [facility], [address] [place], [place] [address]  SARS Coronavirus 2 by RT PCR (hospital order, performed in [facility] hospital lab) *cepheid single result test* Anterior Nasal Swab     Status: None   Collection Time: 04/05/24 11:26 PM   Specimen: Anterior Nasal Swab  Result Value Ref Range Status   SARS Coronavirus 2 by RT PCR NEGATIVE NEGATIVE Final    Comment: (NOTE) SARS-CoV-2 target nucleic acids are NOT DETECTED.  The SARS-CoV-2 RNA is generally detectable in upper and lower respiratory specimens during the acute phase of infection. The lowest concentration of SARS-CoV-2 viral copies this assay can detect is 250 copies / mL. A negative result does not preclude SARS-CoV-2 infection and should not be used as the sole basis for treatment or other patient management decisions.  A negative result may occur with improper specimen collection / handling, submission of specimen other than nasopharyngeal swab, presence of viral mutation(s) within the areas targeted by this assay, and inadequate number of viral copies (<250 copies / mL). A negative result must be combined with clinical observations, patient history, and epidemiological information.  Fact Sheet for Patients:   RoadLapTop.co.za  Fact Sheet for Healthcare Providers: http://kim-miller.com/  This test is not yet approved or  cleared by the [place]  FDA and has been authorized for detection and/or diagnosis of SARS-CoV-2 by FDA under an Emergency Use Authorization (EUA).  This EUA will remain in effect (meaning this test can be used) for the duration of the COVID-19 declaration under Section 564(b)(1) of the Act, 21 U.S.C. section 360bbb-3(b)(1), unless the authorization is terminated or revoked sooner.  Performed at  [facility], [address] [place], [place] [address]          Radiology Studies:  ECHOCARDIOGRAM COMPLETE Result Date: 04/09/2024    ECHOCARDIOGRAM REPORT   Patient Name:   Amy Shah Parr Date of Exam: 04/09/2024 Medical Rec #:  [id number]      Height:       63.0 in Accession #:    [id number]     Weight:       157.0 lb Date of Birth:  [birth year]       BSA:          1.744 m Patient Age:    89 years       BP:           103/86 mmHg Patient Gender: F              HR:           140 bpm. Exam Location:  [facility] Procedure: 2D Echo, Color Doppler and Cardiac Doppler (Both Spectral and Color            Flow Doppler were utilized during procedure). Indications:     Dyspnea R06.00  History:         Patient has no prior history of Echocardiogram examinations.                  Arrythmias:Atrial Fibrillation; Risk Factors:Hypertension and                  Dyslipidemia.  Sonographer:     Christopher Furnace Referring Phys:  [id number] Mayari Matus [facility] Diagnosing Phys: Timothy Gollan MD IMPRESSIONS  1. Left ventricular ejection fraction, by estimation, is 55 to 60%. The left ventricle has normal function. The left ventricle has no regional wall motion abnormalities. Left ventricular diastolic parameters are indeterminate.  2. Right ventricular systolic function is normal. The right ventricular size is normal. There is normal pulmonary artery systolic pressure. The estimated right ventricular systolic pressure is 32.7 mmHg.  3. Left atrial size was moderately dilated.  4. Right atrial size was moderately dilated.  5. The mitral valve is normal in structure. Mild mitral valve regurgitation. No evidence of mitral stenosis.  6. Tricuspid valve regurgitation is moderate.  7. The aortic valve is normal in structure. Aortic valve regurgitation is mild. No aortic stenosis is present.  8. The inferior vena cava is normal in size with greater than 50% respiratory variability, suggesting right atrial pressure of 3 mmHg. FINDINGS  Left Ventricle:  Left ventricular ejection fraction, by estimation, is 55 to 60%. The left ventricle has normal function. The left ventricle has no regional wall motion abnormalities. Strain was performed and the global longitudinal strain is indeterminate. The left ventricular internal cavity size was normal in size. There is no left ventricular hypertrophy. Left ventricular diastolic parameters are indeterminate. Right Ventricle: The right ventricular size is normal. No increase in right ventricular wall thickness. Right ventricular systolic function is normal. There is normal pulmonary artery systolic pressure. The tricuspid regurgitant velocity is 2.63 m/s, and  with an assumed right atrial pressure of 5 mmHg, the estimated right ventricular systolic pressure is 32.7 mmHg. Left Atrium: Left atrial size was moderately dilated. Right Atrium: Right atrial size was moderately dilated. Pericardium: There is no evidence of pericardial effusion. Mitral Valve: The mitral valve is normal in structure. Mild mitral valve regurgitation. No evidence of mitral valve stenosis. Tricuspid Valve: The tricuspid valve is normal in structure. Tricuspid valve regurgitation is moderate . No evidence of tricuspid stenosis. Aortic Valve: The aortic valve is normal in structure. Aortic valve regurgitation is mild. No aortic stenosis  is present. Aortic valve mean gradient measures 2.0 mmHg. Aortic valve peak gradient measures 2.9 mmHg. Aortic valve area, by VTI measures 3.39 cm. Pulmonic Valve: The pulmonic valve was normal in structure. Pulmonic valve regurgitation is not visualized. No evidence of pulmonic stenosis. Aorta: The aortic root is normal in size and structure. Venous: The inferior vena cava is normal in size with greater than 50% respiratory variability, suggesting right atrial pressure of 3 mmHg. IAS/Shunts: No atrial level shunt detected by color flow Doppler. Additional Comments: 3D was performed not requiring image post processing on an  independent workstation and was indeterminate.  LEFT VENTRICLE PLAX 2D LVIDd:         3.50 cm LVIDs:         2.40 cm LV PW:         1.10 cm LV IVS:        1.10 cm LVOT diam:     2.00 cm LV SV:         39 LV SV Index:   22 LVOT Area:     3.14 cm  RIGHT VENTRICLE RV Basal diam:  3.90 cm RV Mid diam:    3.50 cm RV S prime:     14.90 cm/s TAPSE (M-mode): 2.4 cm LEFT ATRIUM             Index        RIGHT ATRIUM           Index LA diam:        4.60 cm 2.64 cm/m   RA Area:     24.20 cm LA Vol (A2C):   73.3 ml 42.02 ml/m  RA Volume:   72.30 ml  41.45 ml/m LA Vol (A4C):   70.9 ml 40.64 ml/m LA Biplane Vol: 73.4 ml 42.08 ml/m  AORTIC VALVE                    PULMONIC VALVE AV Area (Vmax):    3.25 cm     PR End Diast Vel: 12.96 msec AV Area (Vmean):   3.27 cm AV Area (VTI):     3.39 cm AV Vmax:           84.80 cm/s AV Vmean:          61.600 cm/s AV VTI:            0.114 m AV Peak Grad:      2.9 mmHg AV Mean Grad:      2.0 mmHg LVOT Vmax:         87.80 cm/s LVOT Vmean:        64.100 cm/s LVOT VTI:          0.123 m LVOT/AV VTI ratio: 1.08  AORTA Ao Root diam: 2.80 cm MITRAL VALVE                TRICUSPID VALVE MV Area (PHT): 4.15 cm     TR Peak grad:   27.7 mmHg MV Decel Time: 183 msec     TR Vmax:        263.00 cm/s MV E velocity: 105.00 cm/s                             SHUNTS                             Systemic VTI:  0.12 m  Systemic Diam: 2.00 cm Timothy Gollan MD Electronically signed by Evalene Lunger MD Signature Date/Time: 04/09/2024/4:14:30 PM    Final          Scheduled Meds:  benzonatate  200 mg Oral TID   cholecalciferol  1,000 Units Oral Daily   diltiazem   60 mg Oral Q6H   furosemide  20 mg Intravenous BID   guaiFENesin  600 mg Oral BID   metoprolol succinate  100 mg Oral Daily   multivitamin  1 tablet Oral Daily   rivaroxaban   20 mg Oral Q supper   traZODone   25 mg Oral QHS   Continuous Infusions:  Time spent 35 minutes   LOS: 3 days    Cresencio Fairly,  MD [facility]   If 7PM-7AM, please contact night-coverage  04/09/2024, 7:56 PM

## 2024-04-09 NOTE — Progress Notes (Signed)
*  PRELIMINARY RESULTS* Echocardiogram 2D Echocardiogram has been performed.  Floydene Harder 04/09/2024, 10:10 AM

## 2024-04-09 NOTE — Plan of Care (Signed)
  Problem: Education: Goal: Knowledge of General Education information will improve Description: Including pain rating scale, medication(s)/side effects and non-pharmacologic comfort measures Outcome: Progressing   Problem: Clinical Measurements: Goal: Ability to maintain clinical measurements within normal limits will improve Outcome: Progressing   Problem: Clinical Measurements: Goal: Will remain free from infection Outcome: Progressing   Problem: Clinical Measurements: Goal: Diagnostic test results will improve Outcome: Progressing   Problem: Clinical Measurements: Goal: Respiratory complications will improve Outcome: Progressing   Problem: Clinical Measurements: Goal: Cardiovascular complication will be avoided Outcome: Progressing   Problem: Safety: Goal: Ability to remain free from injury will improve Outcome: Progressing   Problem: Skin Integrity: Goal: Risk for impaired skin integrity will decrease Outcome: Progressing    Plan of care, assessment, treatment, monitoring and intervention (s) ongoing, see MAR see flowsheet

## 2024-04-09 NOTE — Consult Note (Signed)
 Cardiology Consultation   Patient ID: TRUC WINFREE MRN: [id number]; DOB: [birth year]  Admit date: 04/05/2024 Date of Consult: 04/09/2024  PCP:  [facility]   [facility] Providers Cardiologist:  None      New consult completed by Dr Perla  Patient Profile: Amy Shah is a 89 y.o. female with a hx of hypertension, hyperlipidemia, vertigo, atrial fibrillation on Xarelto , who is being seen 04/09/2024 for the evaluation of atrial fibrillation RVR at the request of Dr Maree.  History of Present Illness: [name] originally presented to the [facility] emergency department 03/17/2024 from [facility] via EMS with complaints of weakness.  She had a recent diagnosis of atrial fibrillation just this week.  Reported an episode of burning in the left arm and shoulder blade that woke her up from sleep.  She declined anything for pain during her visit.  Blood pressure was suboptimally controlled at 160/97, pulse was 101, respirations of 20, temperature 98.3.  Pertinent labs revealed blood glucose 103, AST 111, ALT 102, TSH 4.872.  EKG revealed atrial fibrillation that was rate controlled.  She was recently started on Toprol but not on anticoagulation for CHA2DS2-VASc score of 4.  So she was started on Xarelto  20 mg daily and referral was placed to the A-fib clinic.  She was also treated with 10 mg of IV diltiazem .  CTA of the chest was completed to evaluate for PE which was negative.  She was subsequently discharged home with outpatient follow-up recommended.  She presented back to the [facility] emergency department 04/03/2024 of an A-fib with heart rates in the 100 250 bpm complaining of extreme palpitations.  She has been compliant with her metoprolol takes 50 mg once daily as well as Xarelto  20 mg daily.  Blood pressures are 51/95, pulse 106, respiration of 22, temperature 98.4.  Pertinent labs revealed blood glucose 111, calcium of 8.5, total protein of 6.2.  Lab work was overall  unremarkable with no significant electrolyte abnormality or anemia.  She was given oral metoprolol.  Continue symptomatic tachycardia with a rate of 120 so she was given IV metoprolol 5 mg.  Heart rate has improved to the 80s and 90s patient continues to feel well no chest pain shortness of breath she was given ED precautions and was sent back to [facility].  She presented back to the [facility] emergency department 04/03/2024 via EMS complaining of weakness and fever.  Heart rate was 130-160 bpm she was in atrial fibrillation RVR.  She also noted she had a cold foot which developed several days prior.  She denies any chest pain or send other associated symptoms.  Blood pressure was 153/88, pulse was 124, respirations are 21, temperature was 99.7.  Pertinent labs revealed blood glucose 123, calcium 8.8, total bili 1.3, INR 2.5, COVID/flu/RSV PCR swab was negative.  Chest x-ray revealed small bilateral effusions.  She was treated with diltiazem  bolus and then drip and a 500 mL bolus of normal saline.  On 04/05/2024 of she was continued on Cardizem  drip, metoprolol 50 mg daily, lisinopril, and had urine and blood cultures completed for subjective fever of Tmax 101.3.  04/06/2024 she remained on a Cardizem  drip at 7.5 cc an hour wean as tolerated to maintain heart rate less than 110 continue metoprolol 50 and Xarelto  20 mg daily.  04/07/2024 Cardizem  drip was weaned off and metoprolol was increased to 75 mg daily.  7/2 she remained tachycardic at rest.  Steroids were stopped, Toprol was increased  to 100 mg and she was given a one-time dose of Cardizem  IV for rate control along with as needed IV Cardizem .  Concerns for viral infection making heart rate difficult to control.  Cardiology was consulted today for continued assistance with management of atrial fibrillation with RVR.  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 Authorizing Provider  acetaminophen (TYLENOL) 500 MG tablet Take 1,000 mg by mouth 2 (two) times daily.   Yes [provider]  albuterol  (PROAIR  HFA) 108 (90 Base) MCG/ACT inhaler Inhale 1 puff into the lungs every 6 (six) hours as needed. 04/20/22  Yes Joshua Cathryne BROCKS, MD  amoxicillin (AMOXIL) 500 MG capsule Take 500 mg by mouth. 4 caps 1 hr before dental procedure   Yes [provider]  calcium carbonate (TUMS - DOSED IN MG ELEMENTAL CALCIUM) 500 MG chewable tablet Chew 1 tablet by mouth as needed for indigestion or heartburn. For reflux   Yes [provider]  carboxymethylcellulose (REFRESH PLUS) 0.5 % SOLN 1 drop daily as needed.   Yes [provider]  Cholecalciferol (VITAMIN D-3) 25 MCG (1000 UT) CAPS Take 1 capsule by mouth daily.   Yes [provider]  Ibuprofen 200 MG CAPS Take by mouth.   Yes [provider]  lisinopril (ZESTRIL) 10 MG tablet Take 10 mg by mouth daily.   Yes [provider]  meclizine  (ANTIVERT ) 12.5 MG tablet Take 12.5 mg by mouth as needed for dizziness. dizziness   Yes [provider]  metoprolol succinate (TOPROL XL) 50 MG 24 hr tablet Take 1 tablet (50 mg total) by mouth daily. Take with or immediately following a meal. 04/03/24 07/02/24 Yes Willo Dunnings, MD  Multiple Vitamins-Minerals (PRESERVISION AREDS PO) Take 2 tablets by mouth daily.   Yes [provider]  rivaroxaban  (XARELTO ) 20 MG TABS tablet Take 1 tablet (20 mg  total) by mouth daily with supper. 03/17/24  Yes Ward, Josette SAILOR, DO  traZODone  (DESYREL ) 50 MG tablet Take 0.5-1 tablets (25-50 mg total) by mouth at bedtime as needed for sleep. Patient not taking: Reported on 04/05/2024 04/24/22   Joshua Cathryne BROCKS, MD    Scheduled Meds:  benzonatate  200 mg Oral TID   cholecalciferol  1,000 Units Oral Daily   furosemide  20 mg Intravenous BID   guaiFENesin  600 mg Oral BID   metoprolol succinate  100 mg Oral Daily   multivitamin  1 tablet Oral Daily   rivaroxaban   20 mg Oral Q supper   traZODone   25 mg Oral QHS   Continuous Infusions:  PRN Meds: acetaminophen, albuterol , artificial tears, diltiazem , diphenhydrAMINE, hydrALAZINE, menthol-cetylpyridinium  Allergies:    Allergies  Allergen Reactions   Codeine Nausea Only and Other (See Comments)    Dizzy  and nausea   Methocarbamol Other (See Comments)    Other reaction(s): Dizziness   Tetanus Toxoids Swelling and Other (See Comments)    Swelling and fever   Zetia  [Ezetimibe ]     Lips swelling, burning   Sulfa Antibiotics Rash    Social History:   Social History   Socioeconomic History   Marital status: Married    Spouse name: Not on file   Number of children: 2   Years of education: Not on file   Highest education level: Not on file  Occupational History   Not on file  Tobacco Use   Smoking status: Never   Smokeless tobacco: Never  Vaping Use   Vaping status: Never Used  Substance and Sexual Activity   Alcohol use: Not Currently   Drug use: No   Sexual activity: Not Currently  Other Topics Concern   Not on file  Social History Narrative   Not on file   Social Drivers of Health   Financial Resource Strain: Low Risk  (08/08/2022)   Overall Financial Resource Strain (CARDIA)    Difficulty of Paying Living Expenses: Not hard at all  Food Insecurity: No Food Insecurity (04/06/2024)   Hunger Vital Sign    Worried About Running Out of Food in the Last Year: Never true    Ran Out  of Food in the Last Year: Never true  Transportation Needs: No Transportation Needs (04/06/2024)   PRAPARE - Administrator, Civil Service (Medical): No    Lack of Transportation (Non-Medical): No  Physical Activity: Inactive (08/08/2022)   Exercise Vital Sign    Days of Exercise per Week: 0 days    Minutes of Exercise per Session: 0 min  Stress: No Stress Concern Present (08/08/2022)   [employer] of Occupational Health - Occupational Stress Questionnaire    Feeling of Stress : Not at all  Social Connections: Socially Isolated (04/07/2024)   Social Connection and Isolation Panel    Frequency of Communication with Friends and Family: More than three times a week    Frequency of Social Gatherings with Friends and Family: Three times a week    Attends Religious Services: Never    Active Member of Clubs or Organizations: No    Attends Banker Meetings: Never    Marital Status: Widowed  Intimate Partner Violence: Not At Risk (04/06/2024)   Humiliation, Afraid, Rape, and Kick questionnaire    Fear of Current or Ex-Partner: No    Emotionally Abused: No    Physically Abused: No    Sexually Abused: No    Family History:    Family History  Problem Relation Age of Onset   Cancer Mother    Stroke Father      ROS:  Please see the history of present illness.  Review of Systems  Constitutional:  Positive for fever and malaise/fatigue.  Respiratory:  Positive for shortness of breath.   Cardiovascular:  Positive for palpitations.  Neurological:  Positive for weakness.    All other ROS reviewed and negative.     Physical Exam/Data: Vitals:   04/08/24 2338 04/09/24 0425 04/09/24 0634 04/09/24 0815  BP: 133/88 137/86  103/86  Pulse: (!) 107 (!) 133  (!) 140  Resp: 18 18    Temp: 98.2 F (36.8 C) 98.4 F (36.9 C)  98.3 F (36.8 C)  TempSrc:  Oral    SpO2: 97% 95% 95% 93%  Weight:      Height:  Intake/Output Summary (Last 24 hours) at 04/09/2024  1055 Last data filed at 04/09/2024 1037 Gross per 24 hour  Intake 360 ml  Output 1 ml  Net 359 ml      04/07/2024    6:35 AM 04/03/2024    1:48 PM 03/17/2024   12:51 AM  Last 3 Weights  Weight (lbs) 156 lb 15.5 oz 157 lb 155 lb  Weight (kg) 71.2 kg 71.215 kg 70.308 kg     Body mass index is 27.81 kg/m.  General:  Well nourished, well developed, in no acute distress HEENT: normal Neck: no JVD Vascular: No carotid bruits; Distal pulses 2+ bilaterally Cardiac:  normal S1, S2; IR IR; no murmur  Lungs:  clear upper lobes with crackles in the bases to auscultation bilaterally; respirations are unlabored at rest on room air Abd: soft, nontender, no hepatomegaly  Ext: 1+ edema BLE Musculoskeletal:  No deformities, BUE and BLE strength normal and equal Skin: warm and dry  Neuro:  CNs 2-12 intact, no focal abnormalities noted Psych:  Normal affect   EKG:  The EKG was personally reviewed and demonstrates: Atrial fibrillation with a rate of 105 Telemetry:  Telemetry was personally reviewed and demonstrates:  atrial fibrillation rates 110-130 bpm  Relevant CV Studies: Echocardiogram ordered and pending  Laboratory Data: High Sensitivity Troponin:   Recent Labs  Lab 03/17/24 0105 03/17/24 0436 04/03/24 1354  TROPONINIHS 10 10 10      Chemistry Recent Labs  Lab 04/03/24 1354 04/05/24 2059 04/06/24 0711 04/09/24 0345  NA 135 136 135 136  K 4.1 3.8 3.6 3.9  CL 104 102 103 103  CO2 22 23 23 26   GLUCOSE 111* 123* 148* 96  BUN 15 14 10 21   CREATININE 0.63 0.67 0.64 0.66  CALCIUM 8.5* 8.8* 8.7* 8.9  MG 2.1  --   --   --   GFRNONAA >60 >60 >60 >60  ANIONGAP 9 11 9 7     Recent Labs  Lab 04/03/24 1354 04/05/24 2059  PROT 6.2* 6.7  ALBUMIN 3.7 3.9  AST 35 35  ALT 27 33  ALKPHOS 84 97  BILITOT 0.7 1.3*   Lipids No results for input(s): CHOL, TRIG, HDL, LABVLDL, LDLCALC, CHOLHDL in the last 168 hours.  Hematology Recent Labs  Lab 04/05/24 2059 04/06/24 0711  04/09/24 0345  WBC 10.1 9.2 9.1  RBC 3.84* 3.81* 4.11  HGB 12.5 12.6 13.0  HCT 37.4 37.3 39.4  MCV 97.4 97.9 95.9  MCH 32.6 33.1 31.6  MCHC 33.4 33.8 33.0  RDW 12.4 12.3 12.0  PLT 204 187 253   Thyroid   Recent Labs  Lab 04/06/24 0711  TSH 1.600  FREET4 1.00    BNPNo results for input(s): BNP, PROBNP in the last 168 hours.  DDimer No results for input(s): DDIMER in the last 168 hours.  Radiology/Studies:  DG Chest Port 1 View Result Date: 04/05/2024 CLINICAL DATA:  Questionable sepsis - evaluate for abnormality EXAM: PORTABLE CHEST - 1 VIEW COMPARISON:  April 03, 2024 FINDINGS: Bibasilar atelectasis. No pneumothorax. Small bilateral pleural effusions. Mild cardiomegaly. Tortuous aorta with aortic atherosclerosis. No acute fracture or destructive lesion. Unchanged focal scoliosis of the upper thoracic spine. IMPRESSION: Small bilateral pleural effusions with bibasilar atelectasis. Electronically Signed   By: Rogelia Myers M.D.   On: 04/05/2024 20:35     Assessment and Plan: Atrial fibrillation with RVR -Patient previously been diagnosed with atrial fibrillation approximately 3 weeks prior - According to patient she had noticed changes in  her blood pressure and heart rate at the skilled nursing facility for approximately a month prior to her calling to be evaluated in the emergency room -Arrived in atrial fibrillation -Has remained in atrial fibrillation with difficulty in controlling rates. -On telemetry she has been in A-fib 110, 130 -She is continued on rivaroxaban  20 mg daily -Toprol-XL 100 mg daily -Echocardiogram ordered and pending with further recommendations to follow -TSH normal -Depending on LVEF will determine additional medication changes -Continue with telemetry monitoring -Does have complaints of shortness of breath, fluid retention, palpitations likely exacerbated from the atrial fibrillation - If unable to rate control during hospitalization may need to  consider TEE/DCCV, if not continue with uninterrupted OAC for 3 to 4 weeks and schedule for outpatient DCCV  Shortness of breath/peripheral edema -Symptoms are likely exacerbated from atrial fibrillation -Concerning for heart failure -Echocardiogram ordered pending with further recommendations to follow -BNP added to labs -Chest x-ray reveals bilateral pleural effusions -Started on furosemide 20 mg IV twice daily -Daily BMP while on diuretic therapy -Daily weights and I's and O's  Hypertension -Blood pressure 103/86 -Continued on furosemide Toprol-XL -PTA lisinopril remains on hold -Vital signs per unit protocol  Cough and fever - Concerning for viral infection - Chest x-ray negative for infiltration - Maintaining oxygen saturation on room air - Negative prosectioning - COVID/flu/RSV swab negative - Continued on Mucinex, bronchodilators as needed, and Tessalon Perles - Blood cultures positive for methicillin-resistant S epidermidis   Risk Assessment/Risk Scores:       New [place]  Heart Association (NYHA) Functional Class NYHA Class III  CHA2DS2-VASc Score = 4   This indicates a 4.8% annual risk of stroke. The patient's score is based upon: CHF History: 0 HTN History: 1 Diabetes History: 0 Stroke History: 0 Vascular Disease History: 0 Age Score: 2 Gender Score: 1        For questions or updates, please contact [facility] Please consult [email or web address] for contact info under    Signed, Tally Mckinnon, NP  04/09/2024 10:55 AM

## 2024-04-10 DIAGNOSIS — I4891 Unspecified atrial fibrillation: Secondary | ICD-10-CM | POA: Diagnosis not present

## 2024-04-10 DIAGNOSIS — I5031 Acute diastolic (congestive) heart failure: Secondary | ICD-10-CM | POA: Diagnosis not present

## 2024-04-10 DIAGNOSIS — R051 Acute cough: Secondary | ICD-10-CM | POA: Diagnosis not present

## 2024-04-10 LAB — CBC
HCT: 37.6 % (ref 36.0–46.0)
Hemoglobin: 12.6 g/dL (ref 12.0–15.0)
MCH: 32.1 pg (ref 26.0–34.0)
MCHC: 33.5 g/dL (ref 30.0–36.0)
MCV: 95.7 fL (ref 80.0–100.0)
Platelets: 242 K/uL (ref 150–400)
RBC: 3.93 MIL/uL (ref 3.87–5.11)
RDW: 12 % (ref 11.5–15.5)
WBC: 9.5 K/uL (ref 4.0–10.5)
nRBC: 0 % (ref 0.0–0.2)

## 2024-04-10 LAB — CULTURE, BLOOD (ROUTINE X 2)
Culture: NO GROWTH
Special Requests: ADEQUATE

## 2024-04-10 LAB — BASIC METABOLIC PANEL WITH GFR
Anion gap: 8 (ref 5–15)
BUN: 22 mg/dL (ref 8–23)
CO2: 25 mmol/L (ref 22–32)
Calcium: 8.5 mg/dL — ABNORMAL LOW (ref 8.9–10.3)
Chloride: 100 mmol/L (ref 98–111)
Creatinine, Ser: 0.78 mg/dL (ref 0.44–1.00)
GFR, Estimated: >60 mL/min (ref >60)
Glucose, Bld: 114 mg/dL — ABNORMAL HIGH (ref 70–99)
Potassium: 3.8 mmol/L (ref 3.5–5.1)
Sodium: 133 mmol/L — ABNORMAL LOW (ref 135–145)

## 2024-04-10 MED ORDER — DIGOXIN 125 MCG PO TABS
0.1250 mg | ORAL_TABLET | Freq: Every day | ORAL | Status: DC
  Administered 2024-04-10 – 2024-04-11 (×2): 0.125 mg via ORAL
  Filled 2024-04-10 (×2): qty 1

## 2024-04-10 MED ORDER — DILTIAZEM HCL ER COATED BEADS 120 MG PO CP24
120.0000 mg | ORAL_CAPSULE | Freq: Every day | ORAL | Status: DC
  Administered 2024-04-10 – 2024-04-11 (×2): 120 mg via ORAL
  Filled 2024-04-10 (×2): qty 1

## 2024-04-10 NOTE — Progress Notes (Signed)
 Rounding Note   Patient Name: Amy Shah Date of Encounter: 04/10/2024  [facility] Cardiologist: Evalene Lunger, MD   Subjective Patient seen on AM rounds. Denies any chest pain or shortness of breath. No accurate I's and O's documented. Remains in atrial fibrillation.  Sitting up in the recliner eating breakfast.  Scheduled Meds:  benzonatate   200 mg Oral TID   cholecalciferol   1,000 Units Oral Daily   digoxin   0.125 mg Oral Daily   diltiazem   120 mg Oral Daily   furosemide   20 mg Intravenous BID   guaiFENesin   600 mg Oral BID   metoprolol  succinate  100 mg Oral Daily   multivitamin  1 tablet Oral Daily   rivaroxaban   20 mg Oral Q supper   traZODone   25 mg Oral QHS   Continuous Infusions:  PRN Meds: acetaminophen , albuterol , artificial tears, diltiazem , diphenhydrAMINE , hydrALAZINE , menthol -cetylpyridinium   Vital Signs  Vitals:   04/09/24 2040 04/09/24 2356 04/10/24 0420 04/10/24 0747  BP: 124/64 117/61 (!) 92/50 (!) 86/59  Pulse: 81 85 77 91  Resp: 18 18 18 16   Temp: 99.2 F (37.3 C) 98.8 F (37.1 C) 97.9 F (36.6 C) 98.2 F (36.8 C)  TempSrc:    Oral  SpO2: 95% 96% 95% 95%  Weight:      Height:        Intake/Output Summary (Last 24 hours) at 04/10/2024 0902 Last data filed at 04/09/2024 1946 Gross per 24 hour  Intake 360 ml  Output 650 ml  Net -290 ml      04/07/2024    6:35 AM 04/03/2024    1:48 PM 03/17/2024   12:51 AM  Last 3 Weights  Weight (lbs) 156 lb 15.5 oz 157 lb 155 lb  Weight (kg) 71.2 kg 71.215 kg 70.308 kg      Telemetry Atrial fibrillation rate controlled 70-80 - Personally Reviewed  ECG  No new tracings - Personally Reviewed  Physical Exam  GEN: No acute distress.   Neck: No JVD Cardiac: IR IR, no murmurs, rubs, or gallops.  Respiratory: Clear to auscultation bilaterally. GI: Soft, nontender, non-distended  MS: 1+ edema; No deformity. Neuro:  Nonfocal  Psych: Normal affect   Labs High Sensitivity Troponin:    Recent Labs  Lab 03/17/24 0105 03/17/24 0436 04/03/24 1354  TROPONINIHS 10 10 10      Chemistry Recent Labs  Lab 04/03/24 1354 04/05/24 2059 04/06/24 0711 04/09/24 0345 04/10/24 0318  NA 135 136 135 136 133*  K 4.1 3.8 3.6 3.9 3.8  CL 104 102 103 103 100  CO2 22 23 23 26 25   GLUCOSE 111* 123* 148* 96 114*  BUN 15 14 10 21 22   CREATININE 0.63 0.67 0.64 0.66 0.78  CALCIUM 8.5* 8.8* 8.7* 8.9 8.5*  MG 2.1  --   --   --   --   PROT 6.2* 6.7  --   --   --   ALBUMIN 3.7 3.9  --   --   --   AST 35 35  --   --   --   ALT 27 33  --   --   --   ALKPHOS 84 97  --   --   --   BILITOT 0.7 1.3*  --   --   --   GFRNONAA >60 >60 >60 >60 >60  ANIONGAP 9 11 9 7 8     Lipids No results for input(s): CHOL, TRIG, HDL, LABVLDL, LDLCALC, CHOLHDL in the  last 168 hours.  Hematology Recent Labs  Lab 04/06/24 0711 04/09/24 0345 04/10/24 0318  WBC 9.2 9.1 9.5  RBC 3.81* 4.11 3.93  HGB 12.6 13.0 12.6  HCT 37.3 39.4 37.6  MCV 97.9 95.9 95.7  MCH 33.1 31.6 32.1  MCHC 33.8 33.0 33.5  RDW 12.3 12.0 12.0  PLT 187 253 242   Thyroid   Recent Labs  Lab 04/06/24 0711  TSH 1.600  FREET4 1.00    BNP Recent Labs  Lab 04/09/24 0345  BNP 321.2*    DDimer No results for input(s): DDIMER in the last 168 hours.   Radiology    Cardiac Studies 2D echo 04/09/2024 1. Left ventricular ejection fraction, by estimation, is 55 to 60%. The  left ventricle has normal function. The left ventricle has no regional  wall motion abnormalities. Left ventricular diastolic parameters are  indeterminate.   2. Right ventricular systolic function is normal. The right ventricular  size is normal. There is normal pulmonary artery systolic pressure. The  estimated right ventricular systolic pressure is 32.7 mmHg.   3. Left atrial size was moderately dilated.   4. Right atrial size was moderately dilated.   5. The mitral valve is normal in structure. Mild mitral valve  regurgitation. No evidence  of mitral stenosis.   6. Tricuspid valve regurgitation is moderate.   7. The aortic valve is normal in structure. Aortic valve regurgitation is  mild. No aortic stenosis is present.   8. The inferior vena cava is normal in size with greater than 50%  respiratory variability, suggesting right atrial pressure of 3 mmHg.   Patient Profile   89 y.o. female with past medical history of hypertension, hyperlipidemia, vertigo, atrial fibrillation on Xarelto , has been seen and evaluated for atrial fibrillation with RVR.  Assessment & Plan  Atrial fibrillation with RVR -Previously diagnosed with atrial fibrillation approximately 3 weeks prior -She arrived in atrial fibrillation with RVR -Has remained in A-fib with difficulty controlling rates -On telemetry she has been 70-80 with aberrantly conducted beats versus PVCs overnight - Continued on rivaroxaban  20 mg daily - Continue Toprol -XL 20 mg daily - Continued on diltiazem  which was consolidated from 60 mg every 6 hours to 120 mg daily - She was also started on dig 0.125 mg daily after dig 0.25mg  IV times one dose yesterday - Dig level ordered for Sunday if she is still hospitalized - Continue with telemetry monitoring - Will need 4 weeks of uninterrupted oral anticoagulation and then can be scheduled for outpatient direct-current cardioversion procedure  HFpEF -Shortness of breath and peripheral edema -Symptoms likely exacerbated from atrial fibrillation -Also concerns for viral infection -Echocardiogram revealed LVEF of 55 to 60%, no RWMA, moderately biatrial dilatation, mild MR and moderate TR. - BNP 321.2 -Chest x-ray bilateral pleural effusions -Continued on furosemide  20 mg IV twice daily -Daily BMP while on diuretic therapy -Daily weights and I's and O's  Hypertension -Blood pressure -Continued on furosemide  and Toprol -XL -PTA lisinopril  remains on hold -Vital signs per unit protocol  Cough and fever - Concerning for viral  infection -Chest x-ray negative for infiltration -COVID/flu/RSV swab negative - Maintaining oxygen saturation on room air -1 out of 4 blood cultures positive for methicillin resistant S epidermidis -Ongoing management per IM   For questions or updates, please contact [facility] Please consult [email or web address] for contact info under     Signed, Brylan Seubert, NP  04/10/2024, 9:02 AM

## 2024-04-10 NOTE — Plan of Care (Signed)
  Problem: Clinical Measurements: Goal: Ability to maintain clinical measurements within normal limits will improve Outcome: Progressing Goal: Cardiovascular complication will be avoided Outcome: Progressing   Problem: Activity: Goal: Risk for activity intolerance will decrease Outcome: Progressing   Problem: Nutrition: Goal: Adequate nutrition will be maintained Outcome: Progressing   Problem: Elimination: Goal: Will not experience complications related to bowel motility Outcome: Progressing Goal: Will not experience complications related to urinary retention Outcome: Progressing   Problem: Pain Managment: Goal: General experience of comfort will improve and/or be controlled Outcome: Progressing   Problem: Safety: Goal: Ability to remain free from injury will improve Outcome: Progressing   Problem: Skin Integrity: Goal: Risk for impaired skin integrity will decrease Outcome: Progressing

## 2024-04-10 NOTE — Progress Notes (Signed)
 PROGRESS NOTE    Amy Shah  FMW:[id number] DOB: 1935-02-16 DOA: 04/05/2024 PCP: [facility]    Brief Narrative:  89 y.o. female with medical history significant of hypertension, hyperlipidemia, A-fib on Xarelto , vertigo, who presents with weakness, subjective fever and cough.   Patient states that she has generalized weakness in the past several days.  States last night she developed fever of 101.3.  Her temperature is 99.7 in ED.  She also reports dry cough, mild SOB, no chest pain.  She states that she had sore throat few days ago which has resolved currently.  Patient has nausea, poor appetite, decreased oral intake, no vomiting, diarrhea or abdominal pain.  Patient denies dysuria or burning with urination.  She states that she has uterine prolapse, with chronic urinary frequency which has not changed.  7/2: Tachycardic at rest.  Stopped steroid and increased Toprol .  One-time dose of Cardizem  IV for rate control 7/3: Cardio c/s, cardizem  60 mg Q 6 hrs and digoxin  once, Lasix  20 mg bid 7/4: Reduced Cardizem  ER 120 mg daily and continue digoxin  0.125 mg daily  Assessment & Plan:   Principal Problem:   Atrial fibrillation with RVR (HCC) Active Problems:   Cough   HTN (hypertension)   Overweight (BMI 25.0-29.9)   Atrial fibrillation with rapid ventricular response (HCC)   Acute diastolic CHF (congestive heart failure) (HCC)   Atrial fibrillation with RVR (HCC): likely Diastolic dysfunction CXR shows b/l pleural effusion, continue Lasix  20 mg iv bid per cardio - Cardio following -Reduced Cardizem  ER to 120 mg daily and continue digoxin  0.125 mg daily - continue metoprolol  succinate 100 mg daily.   - Continue Xarelto  - Echo shows normal LV function   Acute Bronchitis/cough Possibly due to viral infection.  Chest x-ray negative for infiltration.  No oxygen desaturation. Negative procalcitonin.  No indication of infection RVP and COVID-negative Twice daily  Mucinex  As needed bronchodilators Stopped steroids. Added cepacol lozenges 3 times daily Tessalon  Perles not helping much per patient  HTN (hypertension) Continue metoprolol  succinate as above Hold lisinopril  for now.  Consider restarting at discharge   Subjective fever:   Afebrile in ED.  Possibly secondary to viral infection.  Procalcitonin negative.  No signs of bacterial infection.  No indication for antibiotics.  Continue to follow cultures  DVT prophylaxis: Xarelto  Code Status: DNR Family Communication:Daughter via phone 6/30.  None at bedside Disposition Plan: Status is: Inpatient Remains inpatient appropriate because: A-fib RVR.  Viral syndrome     Level of care: Telemetry Cardiac    Subjective:    Slept well last night.  Feeling much better.  Heart rate better controlled, blood pressure somewhat low  Objective: Vitals:   04/10/24 0420 04/10/24 0747 04/10/24 1142 04/10/24 1525  BP: (!) 92/50 (!) 86/59 93/61 (!) 105/55  Pulse: 77 91 (!) 105 100  Resp: 18 16    Temp: 97.9 F (36.6 C) 98.2 F (36.8 C) 98.2 F (36.8 C) 98.5 F (36.9 C)  TempSrc:  Oral  Oral  SpO2: 95% 95% 95% 98%  Weight:      Height:        Intake/Output Summary (Last 24 hours) at 04/10/2024 1533 Last data filed at 04/10/2024 1427 Gross per 24 hour  Intake 720 ml  Output --  Net 720 ml   Filed Weights   04/07/24 0635  Weight: 71.2 kg    Examination:  General exam: NAD Respiratory system: Lungs clear.  Normal work of breathing.  Room  air Cardiovascular system: S1-2, tachycardic, irregular rhythm, no murmurs, no pedal edema Gastrointestinal system: Soft, NT/ND, normal bowel sounds Central nervous system: Alert and oriented. No focal neurological deficits. Extremities: Symmetric 5 x 5 power. Skin: No rashes, lesions or ulcers Psychiatry: Judgement and insight appear normal. Mood & affect appropriate.     Data Reviewed: I have personally reviewed following labs and imaging  studies  CBC: Recent Labs  Lab 04/05/24 2059 04/06/24 0711 04/09/24 0345 04/10/24 0318  WBC 10.1 9.2 9.1 9.5  NEUTROABS 8.1*  --   --   --   HGB 12.5 12.6 13.0 12.6  HCT 37.4 37.3 39.4 37.6  MCV 97.4 97.9 95.9 95.7  PLT 204 187 253 242   Basic Metabolic Panel: Recent Labs  Lab 04/05/24 2059 04/06/24 0711 04/09/24 0345 04/10/24 0318  NA 136 135 136 133*  K 3.8 3.6 3.9 3.8  CL 102 103 103 100  CO2 23 23 26 25   GLUCOSE 123* 148* 96 114*  BUN 14 10 21 22   CREATININE 0.67 0.64 0.66 0.78  CALCIUM 8.8* 8.7* 8.9 8.5*   GFR: Estimated Creatinine Clearance: 46.8 mL/min (by C-G formula based on SCr of 0.78 mg/dL). Liver Function Tests: Recent Labs  Lab 04/05/24 2059  AST 35  ALT 33  ALKPHOS 97  BILITOT 1.3*  PROT 6.7  ALBUMIN 3.9   No results for input(s): LIPASE, AMYLASE in the last 168 hours. No results for input(s): AMMONIA in the last 168 hours. Coagulation Profile: Recent Labs  Lab 04/05/24 2059  INR 2.5*    Thyroid  Function Tests: No results for input(s): TSH, T4TOTAL, FREET4, T3FREE, THYROIDAB in the last 72 hours.  Anemia Panel: No results for input(s): VITAMINB12, FOLATE, FERRITIN, TIBC, IRON, RETICCTPCT in the last 72 hours. Sepsis Labs: Recent Labs  Lab 04/05/24 2059 04/06/24 0711  PROCALCITON  --  <0.10  LATICACIDVEN 1.2  --     Recent Results (from the past 240 hours)  Blood Culture (routine x 2)     Status: Abnormal   Collection Time: 04/05/24  8:59 PM   Specimen: BLOOD RIGHT ARM  Result Value Ref Range Status   Specimen Description   Final    BLOOD RIGHT ARM Performed at [facility], [address] [place], [place] [address]    Special Requests   Final    BOTTLES DRAWN AEROBIC AND ANAEROBIC Blood Culture results may not be optimal due to an inadequate volume of blood received in culture bottles Performed at [facility], [address], [place], [place] [address]    Culture  Setup  Time GRAM POSITIVE COCCI ANAEROBIC BOTTLE ONLY   Final   Culture (A)  Final    STAPHYLOCOCCUS EPIDERMIDIS THE SIGNIFICANCE OF ISOLATING THIS ORGANISM FROM A SINGLE SET OF BLOOD CULTURES WHEN MULTIPLE SETS ARE DRAWN IS UNCERTAIN. PLEASE NOTIFY THE MICROBIOLOGY DEPARTMENT WITHIN ONE WEEK IF SPECIATION AND SENSITIVITIES ARE REQUIRED. Performed at [facility], [address], [place], [place] [address]    Report Status 04/08/2024 FINAL  Final  Blood Culture (routine x 2)     Status: None   Collection Time: 04/05/24  8:59 PM   Specimen: BLOOD LEFT ARM  Result Value Ref Range Status   Specimen Description BLOOD LEFT ARM  Final   Special Requests   Final    BOTTLES DRAWN AEROBIC AND ANAEROBIC Blood Culture adequate volume   Culture   Final    NO GROWTH 5 DAYS Performed at [facility], [address] [place], [place] [address]    Report Status 04/10/2024 FINAL  Final  Urine Culture     Status: Abnormal   Collection Time: 04/05/24  8:59 PM   Specimen: Urine, Random  Result Value Ref Range Status   Specimen Description   Final    URINE, RANDOM Performed at [facility], [address] [place], [place] [address]    Special Requests   Final    NONE Reflexed from [id number] Performed at [facility], [address] [place], [place] [address]    Culture 70,000 COLONIES/mL ESCHERICHIA COLI (A)  Final   Report Status 04/08/2024 FINAL  Final   Organism ID, Bacteria ESCHERICHIA COLI (A)  Final      Susceptibility   Escherichia coli - MIC*    AMPICILLIN 4 SENSITIVE Sensitive     CEFAZOLIN <=4 SENSITIVE Sensitive     CEFEPIME <=0.12 SENSITIVE Sensitive     CEFTRIAXONE <=0.25 SENSITIVE Sensitive     CIPROFLOXACIN <=0.25 SENSITIVE Sensitive     GENTAMICIN <=1 SENSITIVE Sensitive     IMIPENEM <=0.25 SENSITIVE Sensitive     NITROFURANTOIN <=16 SENSITIVE Sensitive     TRIMETH/SULFA <=20 SENSITIVE Sensitive     AMPICILLIN/SULBACTAM <=2 SENSITIVE Sensitive      PIP/TAZO <=4 SENSITIVE Sensitive ug/mL    * 70,000 COLONIES/mL ESCHERICHIA COLI  Blood Culture ID Panel (Reflexed)     Status: Abnormal   Collection Time: 04/05/24  8:59 PM  Result Value Ref Range Status   Enterococcus faecalis NOT DETECTED NOT DETECTED Final   Enterococcus Faecium NOT DETECTED NOT DETECTED Final   Listeria monocytogenes NOT DETECTED NOT DETECTED Final   Staphylococcus species DETECTED (A) NOT DETECTED Final    Comment: RESULT CALLED TO, READ BACK BY AND VERIFIED WITH: ALEX CHAPPELL PHARM.D 04/06/24 1511 KG    Staphylococcus aureus (BCID) NOT DETECTED NOT DETECTED Final   Staphylococcus epidermidis DETECTED (A) NOT DETECTED Final    Comment: Methicillin (oxacillin) resistant coagulase negative staphylococcus. Possible blood culture contaminant (unless isolated from more than one blood culture draw or clinical case suggests pathogenicity). No antibiotic treatment is indicated for blood  culture contaminants. RESULT CALLED TO, READ BACK BY AND VERIFIED WITH: ALEX CHAPPELL PHARM.D 04/06/24 1511 KG    Staphylococcus lugdunensis NOT DETECTED NOT DETECTED Final   Streptococcus species NOT DETECTED NOT DETECTED Final   Streptococcus agalactiae NOT DETECTED NOT DETECTED Final   Streptococcus pneumoniae NOT DETECTED NOT DETECTED Final   Streptococcus pyogenes NOT DETECTED NOT DETECTED Final   A.calcoaceticus-baumannii NOT DETECTED NOT DETECTED Final   Bacteroides fragilis NOT DETECTED NOT DETECTED Final   Enterobacterales NOT DETECTED NOT DETECTED Final   Enterobacter cloacae complex NOT DETECTED NOT DETECTED Final   Escherichia coli NOT DETECTED NOT DETECTED Final   Klebsiella aerogenes NOT DETECTED NOT DETECTED Final   Klebsiella oxytoca NOT DETECTED NOT DETECTED Final   Klebsiella pneumoniae NOT DETECTED NOT DETECTED Final   Proteus species NOT DETECTED NOT DETECTED Final   Salmonella species NOT DETECTED NOT DETECTED Final   Serratia marcescens NOT DETECTED NOT DETECTED  Final   Haemophilus influenzae NOT DETECTED NOT DETECTED Final   Neisseria meningitidis NOT DETECTED NOT DETECTED Final   Pseudomonas aeruginosa NOT DETECTED NOT DETECTED Final   Stenotrophomonas maltophilia NOT DETECTED NOT DETECTED Final   Candida albicans NOT DETECTED NOT DETECTED Final   Candida auris NOT DETECTED NOT DETECTED Final   Candida glabrata NOT DETECTED NOT DETECTED Final   Candida krusei  NOT DETECTED NOT DETECTED Final   Candida parapsilosis NOT DETECTED NOT DETECTED Final   Candida tropicalis NOT DETECTED NOT DETECTED Final   Cryptococcus neoformans/gattii NOT DETECTED NOT DETECTED Final   Methicillin resistance mecA/C DETECTED (A) NOT DETECTED Final    Comment: RESULT CALLED TO, READ BACK BY AND VERIFIED WITH: ALEX CHAPPELL PHARM.D 04/06/24 1511 KG Performed at [facility], [address] [place], [place] [address]   Respiratory (~20 pathogens) panel by PCR     Status: None   Collection Time: 04/05/24 11:26 PM   Specimen: Anterior Nasal Swab; Respiratory  Result Value Ref Range Status   Adenovirus NOT DETECTED NOT DETECTED Final   Coronavirus 229E NOT DETECTED NOT DETECTED Final    Comment: (NOTE) The Coronavirus on the Respiratory Panel, DOES NOT test for the novel  Coronavirus (2019 nCoV)    Coronavirus HKU1 NOT DETECTED NOT DETECTED Final   Coronavirus NL63 NOT DETECTED NOT DETECTED Final   Coronavirus OC43 NOT DETECTED NOT DETECTED Final   Metapneumovirus NOT DETECTED NOT DETECTED Final   Rhinovirus / Enterovirus NOT DETECTED NOT DETECTED Final   Influenza A NOT DETECTED NOT DETECTED Final   Influenza B NOT DETECTED NOT DETECTED Final   Parainfluenza Virus 1 NOT DETECTED NOT DETECTED Final   Parainfluenza Virus 2 NOT DETECTED NOT DETECTED Final   Parainfluenza Virus 3 NOT DETECTED NOT DETECTED Final   Parainfluenza Virus 4 NOT DETECTED NOT DETECTED Final   Respiratory Syncytial Virus NOT DETECTED NOT DETECTED Final   Bordetella pertussis NOT  DETECTED NOT DETECTED Final   Bordetella Parapertussis NOT DETECTED NOT DETECTED Final   Chlamydophila pneumoniae NOT DETECTED NOT DETECTED Final   Mycoplasma pneumoniae NOT DETECTED NOT DETECTED Final    Comment: Performed at [facility], [address], [place], [place] [address]  SARS Coronavirus 2 by RT PCR (hospital order, performed in [facility] hospital lab) *cepheid single result test* Anterior Nasal Swab     Status: None   Collection Time: 04/05/24 11:26 PM   Specimen: Anterior Nasal Swab  Result Value Ref Range Status   SARS Coronavirus 2 by RT PCR NEGATIVE NEGATIVE Final    Comment: (NOTE) SARS-CoV-2 target nucleic acids are NOT DETECTED.  The SARS-CoV-2 RNA is generally detectable in upper and lower respiratory specimens during the acute phase of infection. The lowest concentration of SARS-CoV-2 viral copies this assay can detect is 250 copies / mL. A negative result does not preclude SARS-CoV-2 infection and should not be used as the sole basis for treatment or other patient management decisions.  A negative result may occur with improper specimen collection / handling, submission of specimen other than nasopharyngeal swab, presence of viral mutation(s) within the areas targeted by this assay, and inadequate number of viral copies (<250 copies / mL). A negative result must be combined with clinical observations, patient history, and epidemiological information.  Fact Sheet for Patients:   RoadLapTop.co.za  Fact Sheet for Healthcare Providers: http://kim-miller.com/  This test is not yet approved or  cleared by the [place]  FDA and has been authorized for detection and/or diagnosis of SARS-CoV-2 by FDA under an Emergency Use Authorization (EUA).  This EUA will remain in effect (meaning this test can be used) for the duration of the COVID-19 declaration under Section 564(b)(1) of the Act, 21 U.S.C. section  360bbb-3(b)(1), unless the authorization is terminated or revoked sooner.  Performed at [facility], [address], [place], [place] [address]  Radiology Studies: ECHOCARDIOGRAM COMPLETE Result Date: 04/09/2024    ECHOCARDIOGRAM REPORT   Patient Name:   Amy Shah Date of Exam: 04/09/2024 Medical Rec #:  [id number]      Height:       63.0 in Accession #:    [id number]     Weight:       157.0 lb Date of Birth:  07-22-1935       BSA:          1.744 m Patient Age:    89 years       BP:           103/86 mmHg Patient Gender: F              HR:           140 bpm. Exam Location:  [facility] Procedure: 2D Echo, Color Doppler and Cardiac Doppler (Both Spectral and Color            Flow Doppler were utilized during procedure). Indications:     Dyspnea R06.00  History:         Patient has no prior history of Echocardiogram examinations.                  Arrythmias:Atrial Fibrillation; Risk Factors:Hypertension and                  Dyslipidemia.  Sonographer:     Christopher Furnace Referring Phys:  [id number] Nikia Mangino [facility] Diagnosing Phys: Timothy Gollan MD IMPRESSIONS  1. Left ventricular ejection fraction, by estimation, is 55 to 60%. The left ventricle has normal function. The left ventricle has no regional wall motion abnormalities. Left ventricular diastolic parameters are indeterminate.  2. Right ventricular systolic function is normal. The right ventricular size is normal. There is normal pulmonary artery systolic pressure. The estimated right ventricular systolic pressure is 32.7 mmHg.  3. Left atrial size was moderately dilated.  4. Right atrial size was moderately dilated.  5. The mitral valve is normal in structure. Mild mitral valve regurgitation. No evidence of mitral stenosis.  6. Tricuspid valve regurgitation is moderate.  7. The aortic valve is normal in structure. Aortic valve regurgitation is mild. No aortic stenosis is present.  8. The inferior vena cava is normal in size with greater than 50%  respiratory variability, suggesting right atrial pressure of 3 mmHg. FINDINGS  Left Ventricle: Left ventricular ejection fraction, by estimation, is 55 to 60%. The left ventricle has normal function. The left ventricle has no regional wall motion abnormalities. Strain was performed and the global longitudinal strain is indeterminate. The left ventricular internal cavity size was normal in size. There is no left ventricular hypertrophy. Left ventricular diastolic parameters are indeterminate. Right Ventricle: The right ventricular size is normal. No increase in right ventricular wall thickness. Right ventricular systolic function is normal. There is normal pulmonary artery systolic pressure. The tricuspid regurgitant velocity is 2.63 m/s, and  with an assumed right atrial pressure of 5 mmHg, the estimated right ventricular systolic pressure is 32.7 mmHg. Left Atrium: Left atrial size was moderately dilated. Right Atrium: Right atrial size was moderately dilated. Pericardium: There is no evidence of pericardial effusion. Mitral Valve: The mitral valve is normal in structure. Mild mitral valve regurgitation. No evidence of mitral valve stenosis. Tricuspid Valve: The tricuspid valve is normal in structure. Tricuspid valve regurgitation is moderate . No evidence of tricuspid stenosis. Aortic Valve: The aortic valve is normal in structure. Aortic valve regurgitation is mild. No  aortic stenosis is present. Aortic valve mean gradient measures 2.0 mmHg. Aortic valve peak gradient measures 2.9 mmHg. Aortic valve area, by VTI measures 3.39 cm. Pulmonic Valve: The pulmonic valve was normal in structure. Pulmonic valve regurgitation is not visualized. No evidence of pulmonic stenosis. Aorta: The aortic root is normal in size and structure. Venous: The inferior vena cava is normal in size with greater than 50% respiratory variability, suggesting right atrial pressure of 3 mmHg. IAS/Shunts: No atrial level shunt detected by color  flow Doppler. Additional Comments: 3D was performed not requiring image post processing on an independent workstation and was indeterminate.  LEFT VENTRICLE PLAX 2D LVIDd:         3.50 cm LVIDs:         2.40 cm LV PW:         1.10 cm LV IVS:        1.10 cm LVOT diam:     2.00 cm LV SV:         39 LV SV Index:   22 LVOT Area:     3.14 cm  RIGHT VENTRICLE RV Basal diam:  3.90 cm RV Mid diam:    3.50 cm RV S prime:     14.90 cm/s TAPSE (M-mode): 2.4 cm LEFT ATRIUM             Index        RIGHT ATRIUM           Index LA diam:        4.60 cm 2.64 cm/m   RA Area:     24.20 cm LA Vol (A2C):   73.3 ml 42.02 ml/m  RA Volume:   72.30 ml  41.45 ml/m LA Vol (A4C):   70.9 ml 40.64 ml/m LA Biplane Vol: 73.4 ml 42.08 ml/m  AORTIC VALVE                    PULMONIC VALVE AV Area (Vmax):    3.25 cm     PR End Diast Vel: 12.96 msec AV Area (Vmean):   3.27 cm AV Area (VTI):     3.39 cm AV Vmax:           84.80 cm/s AV Vmean:          61.600 cm/s AV VTI:            0.114 m AV Peak Grad:      2.9 mmHg AV Mean Grad:      2.0 mmHg LVOT Vmax:         87.80 cm/s LVOT Vmean:        64.100 cm/s LVOT VTI:          0.123 m LVOT/AV VTI ratio: 1.08  AORTA Ao Root diam: 2.80 cm MITRAL VALVE                TRICUSPID VALVE MV Area (PHT): 4.15 cm     TR Peak grad:   27.7 mmHg MV Decel Time: 183 msec     TR Vmax:        263.00 cm/s MV E velocity: 105.00 cm/s                             SHUNTS                             Systemic VTI:  0.12  m                             Systemic Diam: 2.00 cm Timothy Gollan MD Electronically signed by Evalene Lunger MD Signature Date/Time: 04/09/2024/4:14:30 PM    Final          Scheduled Meds:  benzonatate   200 mg Oral TID   cholecalciferol   1,000 Units Oral Daily   digoxin   0.125 mg Oral Daily   diltiazem   120 mg Oral Daily   furosemide   20 mg Intravenous BID   guaiFENesin   600 mg Oral BID   metoprolol  succinate  100 mg Oral Daily   multivitamin  1 tablet Oral Daily   rivaroxaban   20 mg Oral  Q supper   traZODone   25 mg Oral QHS   Continuous Infusions:  Time spent 35 minutes   LOS: 4 days    Cresencio Fairly, MD [facility]   If 7PM-7AM, please contact night-coverage  04/10/2024, 3:33 PM

## 2024-04-11 DIAGNOSIS — E663 Overweight: Secondary | ICD-10-CM | POA: Diagnosis not present

## 2024-04-11 DIAGNOSIS — I5031 Acute diastolic (congestive) heart failure: Secondary | ICD-10-CM | POA: Diagnosis not present

## 2024-04-11 DIAGNOSIS — I4891 Unspecified atrial fibrillation: Secondary | ICD-10-CM | POA: Diagnosis not present

## 2024-04-11 DIAGNOSIS — R051 Acute cough: Secondary | ICD-10-CM | POA: Diagnosis not present

## 2024-04-11 LAB — CBC
HCT: 36 % (ref 36.0–46.0)
Hemoglobin: 12.2 g/dL (ref 12.0–15.0)
MCH: 31.9 pg (ref 26.0–34.0)
MCHC: 33.9 g/dL (ref 30.0–36.0)
MCV: 94.2 fL (ref 80.0–100.0)
Platelets: 242 K/uL (ref 150–400)
RBC: 3.82 MIL/uL — ABNORMAL LOW (ref 3.87–5.11)
RDW: 11.9 % (ref 11.5–15.5)
WBC: 11.3 K/uL — ABNORMAL HIGH (ref 4.0–10.5)
nRBC: 0 % (ref 0.0–0.2)

## 2024-04-11 LAB — BASIC METABOLIC PANEL WITH GFR
Anion gap: 11 (ref 5–15)
BUN: 21 mg/dL (ref 8–23)
CO2: 24 mmol/L (ref 22–32)
Calcium: 8.4 mg/dL — ABNORMAL LOW (ref 8.9–10.3)
Chloride: 94 mmol/L — ABNORMAL LOW (ref 98–111)
Creatinine, Ser: 0.67 mg/dL (ref 0.44–1.00)
GFR, Estimated: >60 mL/min (ref >60)
Glucose, Bld: 114 mg/dL — ABNORMAL HIGH (ref 70–99)
Potassium: 3.3 mmol/L — ABNORMAL LOW (ref 3.5–5.1)
Sodium: 129 mmol/L — ABNORMAL LOW (ref 135–145)

## 2024-04-11 MED ORDER — ALBUTEROL SULFATE (2.5 MG/3ML) 0.083% IN NEBU: 2.5000 mg | INHALATION_SOLUTION | RESPIRATORY_TRACT | 12 refills | Status: AC | PRN

## 2024-04-11 MED ORDER — METOPROLOL SUCCINATE ER 100 MG PO TB24: 100.0000 mg | ORAL_TABLET | Freq: Every day | ORAL | 0 refills | Status: AC

## 2024-04-11 MED ORDER — ACETAMINOPHEN 500 MG PO TABS: 1000.0000 mg | ORAL_TABLET | Freq: Two times a day (BID) | ORAL | 0 refills | Status: AC

## 2024-04-11 MED ORDER — DIGOXIN 125 MCG PO TABS: 0.1250 mg | ORAL_TABLET | Freq: Every day | ORAL | 0 refills | Status: DC

## 2024-04-11 MED ORDER — FUROSEMIDE 40 MG PO TABS: 40.0000 mg | ORAL_TABLET | Freq: Every day | ORAL | Status: DC

## 2024-04-11 MED ORDER — TRAZODONE HCL 50 MG PO TABS: 25.0000 mg | ORAL_TABLET | Freq: Every evening | ORAL | 0 refills | Status: AC | PRN

## 2024-04-11 MED ORDER — DILTIAZEM HCL ER COATED BEADS 120 MG PO CP24: 120.0000 mg | ORAL_CAPSULE | Freq: Every day | ORAL | 0 refills | Status: DC

## 2024-04-11 MED ORDER — POTASSIUM CHLORIDE CRYS ER 20 MEQ PO TBCR
40.0000 meq | EXTENDED_RELEASE_TABLET | Freq: Once | ORAL | Status: AC
  Administered 2024-04-11: 40 meq via ORAL
  Filled 2024-04-11: qty 2

## 2024-04-11 MED ORDER — MENTHOL 3 MG MT LOZG: 1.0000 | LOZENGE | OROMUCOSAL | 0 refills | Status: AC | PRN

## 2024-04-11 MED ORDER — FUROSEMIDE 40 MG PO TABS: 40.0000 mg | ORAL_TABLET | Freq: Every day | ORAL | 0 refills | Status: DC

## 2024-04-11 NOTE — TOC Progression Note (Signed)
 Transition of Care [facility]) - Progression Note    Patient Details  Name: Amy Shah MRN: [id number] Date of Birth: 06-13-1935  [facility] CM/SW Contact  Lorraine LILLETTE Fenton, LCSW Phone Number: 04/11/2024, 12:11 PM  Clinical Narrative:    Dilkon from MD that pt needs a Nebulizer ordered.  CSW reached out to daughter Adonna, clarified no Nebulizer equipment in place and asked if there is a DME provider pt prefers. [employer] was then contacted for DME and asked to deliver to pt room. Pt ready for DC once Nebulizer delivered to room. Pt will return to the ALF, [facility].  No further TOC needs.      Barriers to Discharge: No Barriers Identified  Expected Discharge Plan and Services         Expected Discharge Date: 04/11/24                                     Social Determinants of Health (SDOH) Interventions SDOH Screenings   Food Insecurity: No Food Insecurity (04/06/2024)  Housing: Low Risk  (04/07/2024)  Transportation Needs: No Transportation Needs (04/06/2024)  Utilities: Not At Risk (04/06/2024)  Alcohol  Screen: Low Risk  (08/08/2022)  Depression (PHQ2-9): Low Risk  (08/08/2022)  Financial Resource Strain: Low Risk  (08/08/2022)  Physical Activity: Inactive (08/08/2022)  Social Connections: Socially Isolated (04/07/2024)  Stress: No Stress Concern Present (08/08/2022)  Tobacco Use: Low Risk  (04/05/2024)    Readmission Risk Interventions     No data to display

## 2024-04-11 NOTE — Progress Notes (Signed)
 1600: [facility] called by Greig Lo, BSN, RN. Report given to receiving RN, including d/c instructions. Receiving RN verbalized understanding of d/c instructions given. Patient d/cd from [facility] (Room: 251), in stable condition. Patient escorted to main entrance of hospital in w/c by hospital staff. Patient's son to drive patient to receiving facility. VSS. No distress.

## 2024-04-11 NOTE — Progress Notes (Signed)
 Rounding Note    Patient Name: Amy Shah Date of Encounter: 04/11/2024  [facility] Cardiologist: Evalene Lunger, MD   Subjective   Continues to have productive cough, green mucus, hasn't improved much with diuresis. Does improve with breathing treatment. No chest pain. Makes good urine on lasix .   Inpatient Medications    Scheduled Meds:  benzonatate   200 mg Oral TID   cholecalciferol   1,000 Units Oral Daily   digoxin   0.125 mg Oral Daily   diltiazem   120 mg Oral Daily   furosemide   20 mg Intravenous BID   guaiFENesin   600 mg Oral BID   metoprolol  succinate  100 mg Oral Daily   multivitamin  1 tablet Oral Daily   potassium chloride   40 mEq Oral Once   rivaroxaban   20 mg Oral Q supper   traZODone   25 mg Oral QHS   Continuous Infusions:  PRN Meds: acetaminophen , albuterol , artificial tears, diltiazem , diphenhydrAMINE , hydrALAZINE , menthol -cetylpyridinium   Vital Signs    Vitals:   04/10/24 1949 04/10/24 2311 04/11/24 0343 04/11/24 0746  BP: 109/67 110/69 110/75 110/69  Pulse: 74 82 88 (!) 102  Resp: 18 18 18 18   Temp: 98.6 F (37 C) 98 F (36.7 C) 97.7 F (36.5 C) 97.8 F (36.6 C)  TempSrc: Oral Oral Oral   SpO2: 95% 96% 97% 91%  Weight:      Height:        Intake/Output Summary (Last 24 hours) at 04/11/2024 1023 Last data filed at 04/10/2024 1917 Gross per 24 hour  Intake 600 ml  Output --  Net 600 ml      04/07/2024    6:35 AM 04/03/2024    1:48 PM 03/17/2024   12:51 AM  Last 3 Weights  Weight (lbs) 156 lb 15.5 oz 157 lb 155 lb  Weight (kg) 71.2 kg 71.215 kg 70.308 kg      Telemetry    Atrial fibrillation, largely 90s, though can periodically get to 120s - Personally Reviewed  Physical Exam   GEN: No acute distress.   Neck: No JVD Cardiac: irregularly irregular, no murmurs, rubs, or gallops.  Respiratory: Clear to auscultation except for slight rales at bilateral bases GI: Soft, nontender, non-distended  MS:  trace pitting  bilateral LE edema; No deformity. Neuro:  Nonfocal  Psych: Normal affect   New pertinent results (labs, ECG, imaging, cardiac studies)    Echo from 7/3 personally reviewed  Assessment & Plan    Atrial fibrillation with RVR -continue metoprolol  succinate 100 mg daily -transitioned to diltiazem  ER 120 mg daily without additional short acting dose -continue digoxin  0.125 mg daily -has been a balance between avoiding hypotension and optimizing HR control. BP better today, HR has been 70s-100s -CHA2DS2/VAS Stroke Risk Points=5  -continue rivaroxaban  -discussed outpatient cardioversion. She does not wish to pursue at this time. Can re-discuss at outpatient follow up  Shortness of breath LE edema -likely acute diastolic heart failure in the setting of afib RVR -bilateral pleural effusions on CXR -echo with EF 55-60%, moderate TR -improving with diuresis -renal function stable -K 3.3, Sodium 129. K ordered for repletion.  -will transition from IV lasix  to oral lasix  -weights not followed, I/O inaccurate  Cough, fever: viral panel negative. 1 blood culture positive for staph epi, thought to be contaminant  History of hypertension -was on lisinopril  prior to admission, but BP has been soft -stop lisinopril  with start of lasix , can re-assess at follow up  [facility]  will sign off.   Medication Recommendations:  Started this admission: digoxin  0.125 mg daily, diltiazem  ER 120 mg daily, furosemide  40 mg daily (at least until follow up), increased metoprolol  succinate to 100 mg daily. Continue rivaroxaban  20 mg daily. Stop lisinopril  given soft blood pressures Other recommendations (labs, testing, etc):  check BMET in 1 week Follow up as an outpatient:  Will request outpatient cardiology follow up. Can assess furosemide /volume status, blood pressure at that time.    Signed, Shelda Bruckner, MD  04/11/2024, 10:23 AM

## 2024-04-11 NOTE — Progress Notes (Signed)
 PHARMACY CONSULT NOTE - FOLLOW UP  Pharmacy Consult for Electrolyte Monitoring and Replacement   Recent Labs: Potassium (mmol/L)  Date Value  04/11/2024 3.3 (L)  01/21/2015 4.0   Magnesium (mg/dL)  Date Value  93/72/7974 2.1  01/20/2015 2.0   Calcium (mg/dL)  Date Value  92/94/7974 8.4 (L)   Calcium, Total (mg/dL)  Date Value  95/84/7983 7.9 (L)   Albumin (g/dL)  Date Value  93/70/7974 3.9  06/08/2021 4.8 (H)   Phosphorus (mg/dL)  Date Value  93/91/7978 3.7   Sodium (mmol/L)  Date Value  04/11/2024 129 (L)  06/08/2021 140  01/21/2015 133 (L)     Assessment: 89 y.o. female with past medical history of hypertension, hyperlipidemia, vertigo, atrial fibrillation on Xarelto , has been seen and evaluated for atrial fibrillation with RVR. Pharmacy is asked to follow and replace electrolytes.  Diuretics: furosemide  20 mg IV BID  Goal of Therapy:  Electrolytes WNL  Plan:  ---40 mEq po KCl x 1 ---recheck electrolytes in am  Amy Shah ,PharmD Clinical Pharmacist 04/11/2024 10:26 AM

## 2024-04-11 NOTE — Discharge Summary (Signed)
 Physician Discharge Summary   Patient: Amy Shah MRN: [id number] DOB: 20-Nov-1934  Admit date:     04/05/2024  Discharge date: 04/11/24  Discharge Physician: Cresencio Fairly   PCP: Living, [facility]   Recommendations at discharge:    F/up with outpt providers as requested  Discharge Diagnoses: Principal Problem:   Atrial fibrillation with RVR (HCC) Active Problems:   Cough   HTN (hypertension)   Overweight (BMI 25.0-29.9)   Atrial fibrillation with rapid ventricular response (HCC)   Acute diastolic CHF (congestive heart failure) [facility])  Hospital Course: Assessment and Plan:  89 y.o. female with medical history significant of hypertension, hyperlipidemia, A-fib on Xarelto , vertigo, who presents with weakness, subjective fever and cough.   Patient states that she has generalized weakness in the past several days.  States last night she developed fever of 101.3.  Her temperature is 99.7 in ED.  She also reports dry cough, mild SOB, no chest pain.  She states that she had sore throat few days ago which has resolved currently.  Patient has nausea, poor appetite, decreased oral intake, no vomiting, diarrhea or abdominal pain.  Patient denies dysuria or burning with urination.  She states that she has uterine prolapse, with chronic urinary frequency which has not changed.   7/2: Tachycardic at rest.  Stopped steroid and increased Toprol .  One-time dose of Cardizem  IV for rate control 7/3: Cardio c/s, cardizem  60 mg Q 6 hrs and digoxin  once, Lasix  20 mg bid 7/4: Reduced Cardizem  ER 120 mg daily and continue digoxin  0.125 mg daily   Atrial fibrillation with RVR (HCC): likely Diastolic dysfunction\ CXR shows b/l pleural effusion, Diuresed with lasix  - Cardio seen - Echo shows normal LV function -continue metoprolol  succinate 100 mg daily -transitioned to diltiazem  ER 120 mg daily  -continue digoxin  0.125 mg daily -continue rivaroxaban  -Cardio discussed outpatient  cardioversion. She does not wish to pursue at this time. Can re-discuss at outpatient follow up   Acute Bronchitis/cough Possibly due to viral infection.  Chest x-ray negative for infiltration.  No oxygen desaturation. Negative procalcitonin.  No indication of infection RVP and COVID-negative She requested Nebs as it's helping her.    HTN (hypertension) Continue metoprolol  succinate as above  Subjective fever:   Afebrile in ED.  Possibly secondary to viral infection.  Procalcitonin negative.  No signs of bacterial infection.  No indication for antibiotics.         Consultants: Cardio  Disposition: Home Diet recommendation:  Discharge Diet Orders (From admission, onward)     Start     Ordered   04/11/24 0000  Diet - low sodium heart healthy        04/11/24 1209           Carb modified diet DISCHARGE MEDICATION: Allergies as of 04/11/2024       Reactions   Codeine Nausea Only, Other (See Comments)   Dizzy and nausea   Methocarbamol Other (See Comments)   Other reaction(s): Dizziness   Tetanus Toxoids Swelling, Other (See Comments)   Swelling and fever   Zetia  [ezetimibe ]    Lips swelling, burning   Sulfa Antibiotics Rash        Medication List     STOP taking these medications    albuterol  108 (90 Base) MCG/ACT inhaler Commonly known as: ProAir  HFA Replaced by: albuterol  (2.5 MG/3ML) 0.083% nebulizer solution   amoxicillin 500 MG capsule Commonly known as: AMOXIL   Ibuprofen 200 MG Caps   lisinopril  10  MG tablet Commonly known as: ZESTRIL    meclizine  12.5 MG tablet Commonly known as: ANTIVERT        TAKE these medications    acetaminophen  500 MG tablet Commonly known as: TYLENOL  Take 2 tablets (1,000 mg total) by mouth 2 (two) times daily for 7 days.   albuterol  (2.5 MG/3ML) 0.083% nebulizer solution Commonly known as: PROVENTIL  Take 3 mLs (2.5 mg total) by nebulization every 4 (four) hours as needed for shortness of breath or  wheezing. Replaces: albuterol  108 (90 Base) MCG/ACT inhaler   calcium carbonate 500 MG chewable tablet Commonly known as: TUMS - dosed in mg elemental calcium Chew 1 tablet by mouth as needed for indigestion or heartburn. For reflux   carboxymethylcellulose 0.5 % Soln Commonly known as: REFRESH PLUS 1 drop daily as needed.   digoxin  0.125 MG tablet Commonly known as: LANOXIN  Take 1 tablet (0.125 mg total) by mouth daily.   diltiazem  120 MG 24 hr capsule Commonly known as: CARDIZEM  CD Take 1 capsule (120 mg total) by mouth daily.   furosemide  40 MG tablet Commonly known as: LASIX  Take 1 tablet (40 mg total) by mouth daily. Start taking on: April 12, 2024   menthol -cetylpyridinium 3 MG lozenge Commonly known as: CEPACOL Take 1 lozenge (3 mg total) by mouth as needed for sore throat.   metoprolol  succinate 100 MG 24 hr tablet Commonly known as: TOPROL -XL Take 1 tablet (100 mg total) by mouth daily. Take with or immediately following a meal. What changed:  medication strength how much to take   PRESERVISION AREDS PO Take 2 tablets by mouth daily.   rivaroxaban  20 MG Tabs tablet Commonly known as: XARELTO  Take 1 tablet (20 mg total) by mouth daily with supper.   traZODone  50 MG tablet Commonly known as: DESYREL  Take 0.5 tablets (25 mg total) by mouth at bedtime as needed for sleep. What changed: how much to take   Vitamin D-3 25 MCG (1000 UT) Caps Take 1 capsule by mouth daily.               Durable Medical Equipment  (From admission, onward)           Start     Ordered   04/11/24 1242  For home use only DME Nebulizer machine  Once       Comments: Bronchiectasis/acute bronchitis  Question Answer Comment  Patient needs a nebulizer to treat with the following condition Dyspnea   Patient needs a nebulizer to treat with the following condition Bronchiectasis [facility])   Patient needs a nebulizer to treat with the following condition Acute bronchitis   Length  of Need 6 Months   Additional equipment included Administration kit   Additional equipment included Filter      04/11/24 1241            Follow-up Information     Living, [facility]. Schedule an appointment as soon as possible for a visit in 1 week(s).   Specialty: Assisted Living Facility Why: [facility] Discharge F/UP Contact information: [address] [place] [address] [place] [address] [phone]         Perla Evalene PARAS, MD. Schedule an appointment as soon as possible for a visit in 2 week(s).   Specialty: Cardiology Why: [facility] Discharge F/UP Contact information: [address] [place] [address] [phone]                Discharge Exam: Fredricka Weights   04/07/24 [id number]  Weight: 71.2 kg   General exam: NAD Respiratory system: Lungs clear.  Normal work of breathing.  Room air Cardiovascular system: S1-2, tachycardic, irregular rhythm, no murmurs, no pedal edema Gastrointestinal system: Soft, NT/ND, normal bowel sounds Central nervous system: Alert and oriented. No focal neurological deficits. Extremities: Symmetric 5 x 5 power. Skin: No rashes, lesions or ulcers Psychiatry: Judgement and insight appear normal. Mood & affect appropriate.   Condition at discharge: good  The results of significant diagnostics from this hospitalization (including imaging, microbiology, ancillary and laboratory) are listed below for reference.   Imaging Studies: ECHOCARDIOGRAM COMPLETE Result Date: 04/09/2024    ECHOCARDIOGRAM REPORT   Patient Name:   LASHANN HAGG Nack Date of Exam: 04/09/2024 Medical Rec #:  [id number]      Height:       63.0 in Accession #:    [id number]     Weight:       157.0 lb Date of Birth:  10-05-1935       BSA:          1.744 m Patient Age:    89 years       BP:           103/86 mmHg Patient Gender: F              HR:           140 bpm. Exam Location:  [facility] Procedure: 2D Echo, Color Doppler and Cardiac Doppler (Both Spectral  and Color            Flow Doppler were utilized during procedure). Indications:     Dyspnea R06.00  History:         Patient has no prior history of Echocardiogram examinations.                  Arrythmias:Atrial Fibrillation; Risk Factors:Hypertension and                  Dyslipidemia.  Sonographer:     Christopher Furnace Referring Phys:  [id number] Ima Hafner [facility] Diagnosing Phys: Timothy Gollan MD IMPRESSIONS  1. Left ventricular ejection fraction, by estimation, is 55 to 60%. The left ventricle has normal function. The left ventricle has no regional wall motion abnormalities. Left ventricular diastolic parameters are indeterminate.  2. Right ventricular systolic function is normal. The right ventricular size is normal. There is normal pulmonary artery systolic pressure. The estimated right ventricular systolic pressure is 32.7 mmHg.  3. Left atrial size was moderately dilated.  4. Right atrial size was moderately dilated.  5. The mitral valve is normal in structure. Mild mitral valve regurgitation. No evidence of mitral stenosis.  6. Tricuspid valve regurgitation is moderate.  7. The aortic valve is normal in structure. Aortic valve regurgitation is mild. No aortic stenosis is present.  8. The inferior vena cava is normal in size with greater than 50% respiratory variability, suggesting right atrial pressure of 3 mmHg. FINDINGS  Left Ventricle: Left ventricular ejection fraction, by estimation, is 55 to 60%. The left ventricle has normal function. The left ventricle has no regional wall motion abnormalities. Strain was performed and the global longitudinal strain is indeterminate. The left ventricular internal cavity size was normal in size. There is no left ventricular hypertrophy. Left ventricular diastolic parameters are indeterminate. Right Ventricle: The right ventricular size is normal. No increase in right ventricular wall thickness. Right ventricular systolic function is normal. There is normal pulmonary artery systolic  pressure. The tricuspid regurgitant velocity is 2.63  m/s, and  with an assumed right atrial pressure of 5 mmHg, the estimated right ventricular systolic pressure is 32.7 mmHg. Left Atrium: Left atrial size was moderately dilated. Right Atrium: Right atrial size was moderately dilated. Pericardium: There is no evidence of pericardial effusion. Mitral Valve: The mitral valve is normal in structure. Mild mitral valve regurgitation. No evidence of mitral valve stenosis. Tricuspid Valve: The tricuspid valve is normal in structure. Tricuspid valve regurgitation is moderate . No evidence of tricuspid stenosis. Aortic Valve: The aortic valve is normal in structure. Aortic valve regurgitation is mild. No aortic stenosis is present. Aortic valve mean gradient measures 2.0 mmHg. Aortic valve peak gradient measures 2.9 mmHg. Aortic valve area, by VTI measures 3.39 cm. Pulmonic Valve: The pulmonic valve was normal in structure. Pulmonic valve regurgitation is not visualized. No evidence of pulmonic stenosis. Aorta: The aortic root is normal in size and structure. Venous: The inferior vena cava is normal in size with greater than 50% respiratory variability, suggesting right atrial pressure of 3 mmHg. IAS/Shunts: No atrial level shunt detected by color flow Doppler. Additional Comments: 3D was performed not requiring image post processing on an independent workstation and was indeterminate.  LEFT VENTRICLE PLAX 2D LVIDd:         3.50 cm LVIDs:         2.40 cm LV PW:         1.10 cm LV IVS:        1.10 cm LVOT diam:     2.00 cm LV SV:         39 LV SV Index:   22 LVOT Area:     3.14 cm  RIGHT VENTRICLE RV Basal diam:  3.90 cm RV Mid diam:    3.50 cm RV S prime:     14.90 cm/s TAPSE (M-mode): 2.4 cm LEFT ATRIUM             Index        RIGHT ATRIUM           Index LA diam:        4.60 cm 2.64 cm/m   RA Area:     24.20 cm LA Vol (A2C):   73.3 ml 42.02 ml/m  RA Volume:   72.30 ml  41.45 ml/m LA Vol (A4C):   70.9 ml 40.64 ml/m  LA Biplane Vol: 73.4 ml 42.08 ml/m  AORTIC VALVE                    PULMONIC VALVE AV Area (Vmax):    3.25 cm     PR End Diast Vel: 12.96 msec AV Area (Vmean):   3.27 cm AV Area (VTI):     3.39 cm AV Vmax:           84.80 cm/s AV Vmean:          61.600 cm/s AV VTI:            0.114 m AV Peak Grad:      2.9 mmHg AV Mean Grad:      2.0 mmHg LVOT Vmax:         87.80 cm/s LVOT Vmean:        64.100 cm/s LVOT VTI:          0.123 m LVOT/AV VTI ratio: 1.08  AORTA Ao Root diam: 2.80 cm MITRAL VALVE                TRICUSPID VALVE MV Area (PHT):  4.15 cm     TR Peak grad:   27.7 mmHg MV Decel Time: 183 msec     TR Vmax:        263.00 cm/s MV E velocity: 105.00 cm/s                             SHUNTS                             Systemic VTI:  0.12 m                             Systemic Diam: 2.00 cm Evalene Lunger MD Electronically signed by Evalene Lunger MD Signature Date/Time: 04/09/2024/4:14:30 PM    Final    DG Chest Port 1 View Result Date: 04/05/2024 CLINICAL DATA:  Questionable sepsis - evaluate for abnormality EXAM: PORTABLE CHEST - 1 VIEW COMPARISON:  April 03, 2024 FINDINGS: Bibasilar atelectasis. No pneumothorax. Small bilateral pleural effusions. Mild cardiomegaly. Tortuous aorta with aortic atherosclerosis. No acute fracture or destructive lesion. Unchanged focal scoliosis of the upper thoracic spine. IMPRESSION: Small bilateral pleural effusions with bibasilar atelectasis. Electronically Signed   By: Rogelia Myers M.D.   On: 04/05/2024 20:35   DG Chest 2 View Result Date: 04/03/2024 CLINICAL DATA:  Palpitations, atrial fibrillation EXAM: CHEST - 2 VIEW COMPARISON:  03/17/2024 FINDINGS: Stable cardiomegaly and background hyperinflation suggesting COPD/emphysema. No acute focal pneumonia, collapse or consolidation. Negative for edema, large effusion or pneumothorax. Similar thoracic dextroscoliosis. Aorta atherosclerotic. IMPRESSION: Cardiomegaly and hyperinflation. No acute process by plain radiography.  Electronically Signed   By: CHRISTELLA.  Shick M.D.   On: 04/03/2024 14:42   US  ABDOMEN LIMITED RUQ (LIVER/GB) Result Date: 03/17/2024 CLINICAL DATA:  Elevated LFTs EXAM: ULTRASOUND ABDOMEN LIMITED RIGHT UPPER QUADRANT COMPARISON:  None Available. FINDINGS: Gallbladder: No gallstones or wall thickening visualized. No sonographic Murphy sign noted by sonographer. Common bile duct: Diameter: 3.5 mm Liver: No focal lesion identified. Within normal limits in parenchymal echogenicity. Portal vein is patent on color Doppler imaging with normal direction of blood flow towards the liver. Other: Small right-sided pleural effusion is noted. IMPRESSION: No acute abnormality in right upper quadrant. Small right pleural effusion is noted. Electronically Signed   By: Oneil Devonshire M.D.   On: 03/17/2024 02:57   CT Angio Chest PE W and/or Wo Contrast Result Date: 03/17/2024 CLINICAL DATA:  Weakness EXAM: CT ANGIOGRAPHY CHEST WITH CONTRAST TECHNIQUE: Multidetector CT imaging of the chest was performed using the standard protocol during bolus administration of intravenous contrast. Multiplanar CT image reconstructions and MIPs were obtained to evaluate the vascular anatomy. RADIATION DOSE REDUCTION: This exam was performed according to the departmental dose-optimization program which includes automated exposure control, adjustment of the mA and/or kV according to patient size and/or use of iterative reconstruction technique. CONTRAST:  75mL OMNIPAQUE  IOHEXOL  350 MG/ML SOLN COMPARISON:  Plain film from earlier in the same day. FINDINGS: Cardiovascular: Atherosclerotic calcifications of the thoracic aorta are noted. Pulmonary artery shows a normal branching pattern bilaterally. No intraluminal filling defect to suggest pulmonary embolism is noted. Coronary calcifications are noted. Mild cardiac enlargement is noted. Mediastinum/Nodes: Thoracic inlet is within normal limits. No hilar or mediastinal adenopathy is noted. The esophagus as  visualized is within normal limits. Lungs/Pleura: Small bilateral pleural effusions are noted with mild basilar atelectatic changes. No sizable parenchymal nodule  is seen. Linear density is noted in the medial aspect of the right middle lobe anteriorly consistent with atelectatic change. Upper Abdomen: No acute abnormality. Musculoskeletal: No chest wall abnormality. No acute or significant osseous findings. Review of the MIP images confirms the above findings. IMPRESSION: No evidence of pulmonary emboli. Mild right middle lobe atelectatic changes. Small bilateral pleural effusions. Aortic Atherosclerosis (ICD10-I70.0). Electronically Signed   By: Oneil Devonshire M.D.   On: 03/17/2024 01:55   DG Chest Portable 1 View Result Date: 03/17/2024 CLINICAL DATA:  Weakness EXAM: PORTABLE CHEST 1 VIEW COMPARISON:  04/13/2020 FINDINGS: Cardiac shadow is enlarged. Stable scoliosis is noted. The lungs are well aerated bilaterally. No focal infiltrate or effusion is seen. No bony noted. IMPRESSION: No active disease. Electronically Signed   By: Oneil Devonshire M.D.   On: 03/17/2024 01:17    Microbiology: Results for orders placed or performed during the hospital encounter of 04/05/24  Blood Culture (routine x 2)     Status: Abnormal   Collection Time: 04/05/24  8:59 PM   Specimen: BLOOD RIGHT ARM  Result Value Ref Range Status   Specimen Description   Final    BLOOD RIGHT ARM Performed at [facility], [address], [place], [place] [address]    Special Requests   Final    BOTTLES DRAWN AEROBIC AND ANAEROBIC Blood Culture results may not be optimal due to an inadequate volume of blood received in culture bottles Performed at [facility], [address], [place], [place] [address]    Culture  Setup Time GRAM POSITIVE COCCI ANAEROBIC BOTTLE ONLY   Final   Culture (A)  Final    STAPHYLOCOCCUS EPIDERMIDIS THE SIGNIFICANCE OF ISOLATING THIS ORGANISM FROM A SINGLE SET OF BLOOD CULTURES WHEN  MULTIPLE SETS ARE DRAWN IS UNCERTAIN. PLEASE NOTIFY THE MICROBIOLOGY DEPARTMENT WITHIN ONE WEEK IF SPECIATION AND SENSITIVITIES ARE REQUIRED. Performed at [facility], [address] [place], [place] [address]    Report Status 04/08/2024 FINAL  Final  Blood Culture (routine x 2)     Status: None   Collection Time: 04/05/24  8:59 PM   Specimen: BLOOD LEFT ARM  Result Value Ref Range Status   Specimen Description BLOOD LEFT ARM  Final   Special Requests   Final    BOTTLES DRAWN AEROBIC AND ANAEROBIC Blood Culture adequate volume   Culture   Final    NO GROWTH 5 DAYS Performed at [facility], [address] [place], [place] [address]    Report Status 04/10/2024 FINAL  Final  Urine Culture     Status: Abnormal   Collection Time: 04/05/24  8:59 PM   Specimen: Urine, Random  Result Value Ref Range Status   Specimen Description   Final    URINE, RANDOM Performed at [facility], [address], [place], [place] [address]    Special Requests   Final    NONE Reflexed from [id number] Performed at [facility], [address] [place], [place] [address]    Culture 70,000 COLONIES/mL ESCHERICHIA COLI (A)  Final   Report Status 04/08/2024 FINAL  Final   Organism ID, Bacteria ESCHERICHIA COLI (A)  Final      Susceptibility   Escherichia coli - MIC*    AMPICILLIN 4 SENSITIVE Sensitive     CEFAZOLIN <=4 SENSITIVE Sensitive     CEFEPIME <=0.12 SENSITIVE Sensitive     CEFTRIAXONE <=0.25 SENSITIVE Sensitive     CIPROFLOXACIN <=0.25 SENSITIVE Sensitive  GENTAMICIN <=1 SENSITIVE Sensitive     IMIPENEM <=0.25 SENSITIVE Sensitive     NITROFURANTOIN <=16 SENSITIVE Sensitive     TRIMETH/SULFA <=20 SENSITIVE Sensitive     AMPICILLIN/SULBACTAM <=2 SENSITIVE Sensitive     PIP/TAZO <=4 SENSITIVE Sensitive ug/mL    * 70,000 COLONIES/mL ESCHERICHIA COLI  Blood Culture ID Panel (Reflexed)     Status: Abnormal   Collection Time: 04/05/24  8:59 PM  Result Value Ref  Range Status   Enterococcus faecalis NOT DETECTED NOT DETECTED Final   Enterococcus Faecium NOT DETECTED NOT DETECTED Final   Listeria monocytogenes NOT DETECTED NOT DETECTED Final   Staphylococcus species DETECTED (A) NOT DETECTED Final    Comment: RESULT CALLED TO, READ BACK BY AND VERIFIED WITH: ALEX CHAPPELL PHARM.D 04/06/24 1511 KG    Staphylococcus aureus (BCID) NOT DETECTED NOT DETECTED Final   Staphylococcus epidermidis DETECTED (A) NOT DETECTED Final    Comment: Methicillin (oxacillin) resistant coagulase negative staphylococcus. Possible blood culture contaminant (unless isolated from more than one blood culture draw or clinical case suggests pathogenicity). No antibiotic treatment is indicated for blood  culture contaminants. RESULT CALLED TO, READ BACK BY AND VERIFIED WITH: ALEX CHAPPELL PHARM.D 04/06/24 1511 KG    Staphylococcus lugdunensis NOT DETECTED NOT DETECTED Final   Streptococcus species NOT DETECTED NOT DETECTED Final   Streptococcus agalactiae NOT DETECTED NOT DETECTED Final   Streptococcus pneumoniae NOT DETECTED NOT DETECTED Final   Streptococcus pyogenes NOT DETECTED NOT DETECTED Final   A.calcoaceticus-baumannii NOT DETECTED NOT DETECTED Final   Bacteroides fragilis NOT DETECTED NOT DETECTED Final   Enterobacterales NOT DETECTED NOT DETECTED Final   Enterobacter cloacae complex NOT DETECTED NOT DETECTED Final   Escherichia coli NOT DETECTED NOT DETECTED Final   Klebsiella aerogenes NOT DETECTED NOT DETECTED Final   Klebsiella oxytoca NOT DETECTED NOT DETECTED Final   Klebsiella pneumoniae NOT DETECTED NOT DETECTED Final   Proteus species NOT DETECTED NOT DETECTED Final   Salmonella species NOT DETECTED NOT DETECTED Final   Serratia marcescens NOT DETECTED NOT DETECTED Final   Haemophilus influenzae NOT DETECTED NOT DETECTED Final   Neisseria meningitidis NOT DETECTED NOT DETECTED Final   Pseudomonas aeruginosa NOT DETECTED NOT DETECTED Final    Stenotrophomonas maltophilia NOT DETECTED NOT DETECTED Final   Candida albicans NOT DETECTED NOT DETECTED Final   Candida auris NOT DETECTED NOT DETECTED Final   Candida glabrata NOT DETECTED NOT DETECTED Final   Candida krusei NOT DETECTED NOT DETECTED Final   Candida parapsilosis NOT DETECTED NOT DETECTED Final   Candida tropicalis NOT DETECTED NOT DETECTED Final   Cryptococcus neoformans/gattii NOT DETECTED NOT DETECTED Final   Methicillin resistance mecA/C DETECTED (A) NOT DETECTED Final    Comment: RESULT CALLED TO, READ BACK BY AND VERIFIED WITH: ALEX CHAPPELL PHARM.D 04/06/24 1511 KG Performed at [facility], [address] [place], [place] [address]   Respiratory (~20 pathogens) panel by PCR     Status: None   Collection Time: 04/05/24 11:26 PM   Specimen: Anterior Nasal Swab; Respiratory  Result Value Ref Range Status   Adenovirus NOT DETECTED NOT DETECTED Final   Coronavirus 229E NOT DETECTED NOT DETECTED Final    Comment: (NOTE) The Coronavirus on the Respiratory Panel, DOES NOT test for the novel  Coronavirus (2019 nCoV)    Coronavirus HKU1 NOT DETECTED NOT DETECTED Final   Coronavirus NL63 NOT DETECTED NOT DETECTED Final   Coronavirus OC43 NOT DETECTED NOT DETECTED Final   Metapneumovirus NOT DETECTED NOT DETECTED  Final   Rhinovirus / Enterovirus NOT DETECTED NOT DETECTED Final   Influenza A NOT DETECTED NOT DETECTED Final   Influenza B NOT DETECTED NOT DETECTED Final   Parainfluenza Virus 1 NOT DETECTED NOT DETECTED Final   Parainfluenza Virus 2 NOT DETECTED NOT DETECTED Final   Parainfluenza Virus 3 NOT DETECTED NOT DETECTED Final   Parainfluenza Virus 4 NOT DETECTED NOT DETECTED Final   Respiratory Syncytial Virus NOT DETECTED NOT DETECTED Final   Bordetella pertussis NOT DETECTED NOT DETECTED Final   Bordetella Parapertussis NOT DETECTED NOT DETECTED Final   Chlamydophila pneumoniae NOT DETECTED NOT DETECTED Final   Mycoplasma pneumoniae NOT DETECTED  NOT DETECTED Final    Comment: Performed at [facility], [address] [place], [place] [address]  SARS Coronavirus 2 by RT PCR (hospital order, performed in [facility] hospital lab) *cepheid single result test* Anterior Nasal Swab     Status: None   Collection Time: 04/05/24 11:26 PM   Specimen: Anterior Nasal Swab  Result Value Ref Range Status   SARS Coronavirus 2 by RT PCR NEGATIVE NEGATIVE Final    Comment: (NOTE) SARS-CoV-2 target nucleic acids are NOT DETECTED.  The SARS-CoV-2 RNA is generally detectable in upper and lower respiratory specimens during the acute phase of infection. The lowest concentration of SARS-CoV-2 viral copies this assay can detect is 250 copies / mL. A negative result does not preclude SARS-CoV-2 infection and should not be used as the sole basis for treatment or other patient management decisions.  A negative result may occur with improper specimen collection / handling, submission of specimen other than nasopharyngeal swab, presence of viral mutation(s) within the areas targeted by this assay, and inadequate number of viral copies (<250 copies / mL). A negative result must be combined with clinical observations, patient history, and epidemiological information.  Fact Sheet for Patients:   RoadLapTop.co.za  Fact Sheet for Healthcare Providers: http://kim-miller.com/  This test is not yet approved or  cleared by the [place]  FDA and has been authorized for detection and/or diagnosis of SARS-CoV-2 by FDA under an Emergency Use Authorization (EUA).  This EUA will remain in effect (meaning this test can be used) for the duration of the COVID-19 declaration under Section 564(b)(1) of the Act, 21 U.S.C. section 360bbb-3(b)(1), unless the authorization is terminated or revoked sooner.  Performed at [facility], [address] [place], [place] [address]     Labs: CBC: Recent Labs   Lab 04/05/24 2059 04/06/24 0711 04/09/24 0345 04/10/24 0318 04/11/24 0448  WBC 10.1 9.2 9.1 9.5 11.3*  NEUTROABS 8.1*  --   --   --   --   HGB 12.5 12.6 13.0 12.6 12.2  HCT 37.4 37.3 39.4 37.6 36.0  MCV 97.4 97.9 95.9 95.7 94.2  PLT 204 187 253 242 242   Basic Metabolic Panel: Recent Labs  Lab 04/05/24 2059 04/06/24 0711 04/09/24 0345 04/10/24 0318 04/11/24 0448  NA 136 135 136 133* 129*  K 3.8 3.6 3.9 3.8 3.3*  CL 102 103 103 100 94*  CO2 23 23 26 25 24   GLUCOSE 123* 148* 96 114* 114*  BUN 14 10 21 22 21   CREATININE 0.67 0.64 0.66 0.78 0.67  CALCIUM 8.8* 8.7* 8.9 8.5* 8.4*   Liver Function Tests: Recent Labs  Lab 04/05/24 2059  AST 35  ALT 33  ALKPHOS 97  BILITOT 1.3*  PROT 6.7  ALBUMIN 3.9   CBG: No results for input(s): GLUCAP in the  last 168 hours.  Discharge time spent: greater than 30 minutes.  Signed: Cresencio Fairly, MD [facility] 04/11/2024

## 2024-04-11 NOTE — NC FL2 (Signed)
 [place]  MEDICAID FL2 LEVEL OF CARE FORM     IDENTIFICATION  Patient Name: Amy Shah Birthdate: 06-21-1935 Sex: female Admission Date (Current Location): 04/05/2024  [facility] and [place] Number:  Chiropodist and Address:  [facility], [address], [place], [place] [address]      Provider Number:    Attending Physician Name and Address:  Maree Hue, MD  Relative Name and Phone Number:  Cloretta Priestly (Daughter)  [phone] (Home Phone)    Current Level of Care: Hospital Recommended Level of Care: Assisted Living Facility Prior Approval Number:    Date Approved/Denied:   PASRR Number:    Discharge Plan: Other (Comment) [facility])    Current Diagnoses: Patient Active Problem List   Diagnosis Date Noted   Acute diastolic CHF (congestive heart failure) (HCC) 04/09/2024   Atrial fibrillation with rapid ventricular response (HCC) 04/06/2024   Atrial fibrillation with RVR (HCC) 04/05/2024   HTN (hypertension) 04/05/2024   Cough 04/05/2024   Overweight (BMI 25.0-29.9) 04/05/2024   History of partial hysterectomy 12/22/2020   Osteoarthritis of hip 08/28/2018   Degeneration of lumbar intervertebral disc 10/31/2017   History of total hip arthroplasty 10/31/2017   Sacroiliac joint pain 10/31/2017   Scoliosis of lumbar spine 10/31/2017   Risk for falls 03/22/2017    Orientation RESPIRATION BLADDER Height & Weight     Self, Situation, Place  Normal Continent Weight: 156 lb 15.5 oz (71.2 kg) Height:  5' 3 (160 cm)  BEHAVIORAL SYMPTOMS/MOOD NEUROLOGICAL BOWEL NUTRITION STATUS      Continent Diet  AMBULATORY STATUS COMMUNICATION OF NEEDS Skin   Limited Assist Verbally Normal                       Personal Care Assistance Level of Assistance  Bathing, Feeding, Dressing Bathing Assistance: Limited assistance Feeding assistance: Limited assistance Dressing Assistance: Limited assistance     Functional Limitations  Info  Sight Sight Info: Adequate (Corrected)        SPECIAL CARE FACTORS FREQUENCY                       Contractures Contractures Info: Not present    Additional Factors Info  Code Status, Allergies Code Status Info: DNR- Limited Allergies Info: Codeine, Methocarbamol, Tetanus Toxoids, Zetia  (Ezetimibe ), Sulfa Antibiotics           Current Medications (04/11/2024):  This is the current hospital active medication list Current Facility-Administered Medications  Medication Dose Route Frequency Provider Last Rate Last Admin   acetaminophen  (TYLENOL ) tablet 650 mg  650 mg Oral Q6H PRN Niu, Xilin, MD       albuterol  (PROVENTIL ) (2.5 MG/3ML) 0.083% nebulizer solution 2.5 mg  2.5 mg Nebulization Q4H PRN Belue, Nathan S, RPH   2.5 mg at 04/11/24 0055   artificial tears ophthalmic solution 1 drop  1 drop Both Eyes Daily PRN Niu, Xilin, MD       benzonatate  (TESSALON ) capsule 200 mg  200 mg Oral TID Sreenath, Sudheer B, MD   200 mg at 04/11/24 1234   cholecalciferol  (VITAMIN D3) tablet 1,000 Units  1,000 Units Oral Daily Niu, Xilin, MD   1,000 Units at 04/11/24 1236   digoxin  (LANOXIN ) tablet 0.125 mg  0.125 mg Oral Daily Gollan, Timothy J, MD   0.125 mg at 04/11/24 1236   diltiazem  (CARDIZEM  CD) 24 hr capsule 120 mg  120 mg Oral Daily Gollan, Timothy J, MD  120 mg at 04/11/24 1234   diltiazem  (CARDIZEM ) injection 5 mg  5 mg Intravenous Q12H PRN Shah, Vipul, MD   5 mg at 04/09/24 0426   diphenhydrAMINE  (BENADRYL ) injection 12.5 mg  12.5 mg Intravenous Q8H PRN Niu, Xilin, MD       NOREEN ON 04/12/2024] furosemide  (LASIX ) tablet 40 mg  40 mg Oral Daily Lonni Slain, MD       guaiFENesin  (MUCINEX ) 12 hr tablet 600 mg  600 mg Oral BID Sreenath, Sudheer B, MD   600 mg at 04/11/24 1235   hydrALAZINE  (APRESOLINE ) injection 5 mg  5 mg Intravenous Q2H PRN Niu, Xilin, MD       menthol -cetylpyridinium (CEPACOL) lozenge 3 mg  1 lozenge Oral PRN Shah, Vipul, MD   3 mg at 04/09/24 1530    metoprolol  succinate (TOPROL -XL) 24 hr tablet 100 mg  100 mg Oral Daily Shah, Vipul, MD   100 mg at 04/11/24 1234   multivitamin (PROSIGHT) tablet 1 tablet  1 tablet Oral Daily Niu, Xilin, MD   1 tablet at 04/11/24 1237   rivaroxaban  (XARELTO ) tablet 20 mg  20 mg Oral Q supper Niu, Xilin, MD   20 mg at 04/10/24 1709   traZODone  (DESYREL ) tablet 25 mg  25 mg Oral QHS Shah, Vipul, MD   25 mg at 04/10/24 2054     Discharge Medications: Please see discharge summary for a list of discharge medications.  Relevant Imaging Results:  Relevant Lab Results:   Additional Information [id number]  Lorraine LILLETTE Fenton, LCSW

## 2024-04-21 ENCOUNTER — Encounter: Payer: Self-pay | Admitting: *Deleted

## 2024-04-28 ENCOUNTER — Other Ambulatory Visit
Admission: RE | Admit: 2024-04-28 | Discharge: 2024-04-28 | Disposition: A | Discharge status: Home / Self Care | Source: Ambulatory Visit | Attending: Medical | Admitting: Medical

## 2024-04-28 ENCOUNTER — Encounter: Payer: Self-pay | Admitting: Medical

## 2024-04-28 ENCOUNTER — Ambulatory Visit: Attending: Medical | Admitting: Medical

## 2024-04-28 VITALS — BP 116/74 | HR 79 | Ht 63.0 in | Wt 155.0 lb

## 2024-04-28 DIAGNOSIS — I4891 Unspecified atrial fibrillation: Secondary | ICD-10-CM | POA: Diagnosis present

## 2024-04-28 DIAGNOSIS — I5032 Chronic diastolic (congestive) heart failure: Secondary | ICD-10-CM | POA: Diagnosis not present

## 2024-04-28 DIAGNOSIS — I10 Essential (primary) hypertension: Secondary | ICD-10-CM | POA: Insufficient documentation

## 2024-04-28 DIAGNOSIS — R600 Localized edema: Secondary | ICD-10-CM | POA: Diagnosis present

## 2024-04-28 DIAGNOSIS — J90 Pleural effusion, not elsewhere classified: Secondary | ICD-10-CM | POA: Diagnosis present

## 2024-04-28 DIAGNOSIS — Z79899 Other long term (current) drug therapy: Secondary | ICD-10-CM | POA: Insufficient documentation

## 2024-04-28 LAB — BASIC METABOLIC PANEL WITH GFR
Anion gap: 10 (ref 5–15)
BUN: 21 mg/dL (ref 8–23)
CO2: 27 mmol/L (ref 22–32)
Calcium: 9.1 mg/dL (ref 8.9–10.3)
Chloride: 98 mmol/L (ref 98–111)
Creatinine, Ser: 0.8 mg/dL (ref 0.44–1.00)
GFR, Estimated: >60 mL/min (ref >60)
Glucose, Bld: 105 mg/dL — ABNORMAL HIGH (ref 70–99)
Potassium: 4 mmol/L (ref 3.5–5.1)
Sodium: 135 mmol/L (ref 135–145)

## 2024-04-28 LAB — CBC
HCT: 36 % (ref 36.0–46.0)
Hemoglobin: 11.9 g/dL — ABNORMAL LOW (ref 12.0–15.0)
MCH: 32.2 pg (ref 26.0–34.0)
MCHC: 33.1 g/dL (ref 30.0–36.0)
MCV: 97.3 fL (ref 80.0–100.0)
Platelets: 307 K/uL (ref 150–400)
RBC: 3.7 MIL/uL — ABNORMAL LOW (ref 3.87–5.11)
RDW: 12.3 % (ref 11.5–15.5)
WBC: 4.7 K/uL (ref 4.0–10.5)
nRBC: 0 % (ref 0.0–0.2)

## 2024-04-28 LAB — BRAIN NATRIURETIC PEPTIDE: B Natriuretic Peptide: 447.4 pg/mL — ABNORMAL HIGH (ref 0.0–100.0)

## 2024-04-28 MED ORDER — FUROSEMIDE 40 MG PO TABS: 40.0000 mg | ORAL_TABLET | Freq: Every day | ORAL | 3 refills | Status: AC

## 2024-04-28 MED ORDER — AMIODARONE HCL 200 MG PO TABS: ORAL_TABLET | ORAL | 3 refills | Status: AC

## 2024-04-28 MED ORDER — POTASSIUM CHLORIDE CRYS ER 20 MEQ PO TBCR: 20.0000 meq | EXTENDED_RELEASE_TABLET | Freq: Every day | ORAL | 3 refills | Status: AC

## 2024-04-28 NOTE — Progress Notes (Unsigned)
 Cardiology Office Note   Date:  04/28/2024  ID:  Amy Shah, DOB Oct 14, 1934, MRN [id number] PCP: [facility]  [facility] Providers Cardiologist:  Evalene Lunger, MD   History of Present Illness Amy Shah is a 89 y.o. female with a history of hypertension, hyperlipidemia, vertigo, and A-fib on Xarelto  who is being seen for hospital follow-up.  Patient presented to [facility] 03/17/2024 from [facility] via EMS with complaints of weakness.  She had recent been diagnosed with A-fib.  In the ER she was found to be in A-fib that was rate controlled.  She had been started on Toprol  but not on anticoagulation with a CHA2DS2-VASc score of 4.  She was started on Xarelto  and referred to the A-fib clinic.  She was treated with 10 mg of IV Dilt.  CT of the chest was negative for PE.  She was discharged home without outpatient follow-up.  She presented back to [facility] 04/03/2024 with A-fib with heart rates 100 to 120s bpm complaining of palpitations.  She reported compliance with metoprolol  and Xarelto .  Blood pressure was low at 151/95.  She was given IV metoprolol  and heart rate improved to the 80s and 90s.  Patient began to feel well and was sent back to [facility].  Patient presented back to [facility] 04/03/2024 via EMS complaining of weakness and fever.  Heart rate was 130-160 and she was in A-fib RVR.  She noted a cold foot which had developed several days prior.  She denied any chest pain.  Blood pressure was 153/88, temperature was 99.7.  Chest x-ray showed small bilateral effusions.  She was started on IV Dilt bolus then drip.  Patient was continued on Cardizem  drip, metoprolol  50 mg daily, lisinopril .  Heart rates improved and IV Cardizem  was slowly weaned off.  She was on metoprolol  75 mg daily.  Patient remained tachycardic at rest.  Cardiology was asked to see for management of A-fib RVR.  Metoprolol  was further increased to 100 mg daily and diltiazem  was transitioned to 120 mg  daily.  Patient was started on digoxin .  Cardioversion was offered, but she did not wish to pursue at that time.  Today, the patient rpeorts coughing for 2 months. She feels in her chest it feels she is Stuck with gluie. She describes crawling spasms in her chest. Appetite has been. Any time she starts to lay down she feels SOB and coughs. No chest pain. She does feel heart racing and fluttering at times. She has lower leg edema that is the same since the hospitalization. L>R. She was able to ambulate OK prior to admission. She is now mostly in the wheelchair.   Studies Reviewed EKG Interpretation Date/Time:  Tuesday April 28 2024 16:00:10 EDT Ventricular Rate:  79 PR Interval:    QRS Duration:  80 QT Interval:  394 QTC Calculation: 451 R Axis:   -9  Text Interpretation: Atrial fibrillation Minimal voltage criteria for LVH, may be normal variant ( R in aVL ) ST & T wave abnormality, consider inferolateral ischemia When compared with ECG of 05-Apr-2024 20:05, PREVIOUS ECG IS PRESENT Confirmed by Franchester, Davionne Mastrangelo ([id number]) on 04/28/2024 4:04:17 PM    Echo 04/2024 1. Left ventricular ejection fraction, by estimation, is 55 to 60%. The  left ventricle has normal function. The left ventricle has no regional  wall motion abnormalities. Left ventricular diastolic parameters are  indeterminate.   2. Right ventricular systolic function is normal. The right ventricular  size is  normal. There is normal pulmonary artery systolic pressure. The  estimated right ventricular systolic pressure is 32.7 mmHg.   3. Left atrial size was moderately dilated.   4. Right atrial size was moderately dilated.   5. The mitral valve is normal in structure. Mild mitral valve  regurgitation. No evidence of mitral stenosis.   6. Tricuspid valve regurgitation is moderate.   7. The aortic valve is normal in structure. Aortic valve regurgitation is  mild. No aortic stenosis is present.   8. The inferior vena cava is normal in  size with greater than 50%  respiratory variability, suggesting right atrial pressure of 3 mmHg.    Physical Exam VS:  BP 116/74   Pulse 79   Ht 5' 3 (1.6 m)   Wt 155 lb (70.3 kg)   SpO2 96%   BMI 27.46 kg/m        Wt Readings from Last 3 Encounters:  04/28/24 155 lb (70.3 kg)  04/07/24 156 lb 15.5 oz (71.2 kg)  04/03/24 157 lb (71.2 kg)    GEN: Well nourished, well developed in no acute distress NECK: No JVD; No carotid bruits CARDIAC: Irreg Irreg, no murmurs, rubs, gallops RESPIRATORY:  diminished at bases ABDOMEN: Soft, non-tender, non-distended EXTREMITIES:  1-2+ lower leg edema; No deformity   ASSESSMENT AND PLAN  Persistent Afib Patient is in rate controlled afib today. She is not interested in cardioversion, but is open to trying amiodarone . She has been on Xarelto  since 6/10. I will stop digoxin  and start amiodarone  400mg  BID x 7 days, 200mg  BID x 7 days, 200mg  daily thereafter. Continue diltiazem  120mg  daily and toprol  100mg  daily for now. If she converts, may need adjust meds if she becomes bradycardic. CBC today.  Small pleural effusions/cough LLE HFpEF Patient reports persistent orthopnea and lower leg edema, which may be exacerbated by Afib. Decreased breath sounds at the bases. Echo during admission showed LVEF 55-60%, indeterminate diastolic function. I will increase lasix  to 40mg  BID x 3 days, then back down to lasix  40mg  daily. I will check BNP and BMET today. I will start klor-con  20meq daily.   HTN BP is good today, continue diltiazem  and Toprol .        Dispo: Follow-up in 1 month  Signed, Franca Stakes VEAR Fishman, PA-C

## 2024-04-28 NOTE — Patient Instructions (Signed)
 Medication Instructions:  Your physician recommends the following medication changes.  STOP TAKING: Digoxin   START TAKING: Potassium 20 mEq daily Amiodarone   400 mg two x daily for a week starting 04/30/2024 Amiodarone   200 mg two x daily for a week Amioderone  200 mg Daily   INCREASE: Lasix  to 40 mg twice daily x 3 days and then return to 40 mg daily   *If you need a refill on your cardiac medications before your next appointment, please call your pharmacy*  Lab Work: Your provider would like for you to have following labs drawn today BNP, BMP, CBC.   If you have labs (blood work) drawn today and your tests are completely normal, you will receive your results only by: MyChart Message (if you have MyChart) OR A paper copy in the mail If you have any lab test that is abnormal or we need to change your treatment, we will call you to review the results.  Testing/Procedures: No test ordered today   Follow-Up: At [facility], you and your health needs are our priority.  As part of our continuing mission to provide you with exceptional heart care, our providers are all part of one team.  This team includes your primary Cardiologist (physician) and Advanced Practice Providers or APPs (Physician Assistants and Nurse Practitioners) who all work together to provide you with the care you need, when you need it.  Your next appointment:   1 month(s)  Provider:   Timothy Gollan, MD or Cadence Furth, PA-C    We recommend signing up for the patient portal called MyChart.  Sign up information is provided on this After Visit Summary.  MyChart is used to connect with patients for Virtual Visits (Telemedicine).  Patients are able to view lab/test results, encounter notes, upcoming appointments, etc.  Non-urgent messages can be sent to your provider as well.   To learn more about what you can do with MyChart, go to [email or web address].

## 2024-04-29 ENCOUNTER — Ambulatory Visit: Payer: Self-pay

## 2024-05-21 ENCOUNTER — Encounter: Payer: Self-pay | Admitting: Medical

## 2024-05-21 ENCOUNTER — Ambulatory Visit: Attending: Medical | Admitting: Medical

## 2024-05-21 VITALS — BP 115/60 | HR 52 | Ht 63.0 in | Wt 152.8 lb

## 2024-05-21 DIAGNOSIS — I4891 Unspecified atrial fibrillation: Secondary | ICD-10-CM | POA: Insufficient documentation

## 2024-05-21 NOTE — Patient Instructions (Signed)
 Medication Instructions:  Your physician has recommended you make the following change in your medication:   STOP Diltiazem   *If you need a refill on your cardiac medications before your next appointment, please call your pharmacy*  Lab Work: None  If you have labs (blood work) drawn today and your tests are completely normal, you will receive your results only by: MyChart Message (if you have MyChart) OR A paper copy in the mail If you have any lab test that is abnormal or we need to change your treatment, we will call you to review the results.  Testing/Procedures: None  Follow-Up: At [facility], you and your health needs are our priority.  As part of our continuing mission to provide you with exceptional heart care, our providers are all part of one team.  This team includes your primary Cardiologist (physician) and Advanced Practice Providers or APPs (Physician Assistants and Nurse Practitioners) who all work together to provide you with the care you need, when you need it.  Your next appointment:   2-3 month(s)  Provider:   Evalene Lunger, MD or Cadence Franchester, PA-C

## 2024-05-21 NOTE — Progress Notes (Signed)
 Cardiology Office Note   Date:  05/21/2024  ID:  KRYSTIE LEITER, DOB Apr 25, 1935, MRN [id number] PCP: [facility]  [facility] Providers Cardiologist:  Evalene Lunger, MD   History of Present Illness Amy Shah is a 89 y.o. female  with a history of hypertension, hyperlipidemia, vertigo, and A-fib on Xarelto who is being seen for follow-up of Afib.   Patient presented to [facility] 03/17/2024 from [facility] via EMS with complaints of weakness.  She had recent been diagnosed with A-fib.  In the ER she was found to be in A-fib that was rate controlled.  She had been started on Toprol but not on anticoagulation with a CHA2DS2-VASc score of 4.  She was started on Xarelto and referred to the A-fib clinic.  She was treated with 10 mg of IV Dilt.  CT of the chest was negative for PE.  She was discharged home without outpatient follow-up.   She presented back to [facility] 04/03/2024 with A-fib with heart rates 100 to 120s bpm complaining of palpitations.  She reported compliance with metoprolol and Xarelto.  Blood pressure was low at 151/95.  She was given IV metoprolol and heart rate improved to the 80s and 90s.  Patient began to feel well and was sent back to [facility].   Patient presented back to [facility] 04/03/2024 via EMS complaining of weakness and fever.  Heart rate was 130-160 and she was in A-fib RVR.  She noted a cold foot which had developed several days prior.  She denied any chest pain.  Blood pressure was 153/88, temperature was 99.7.  Chest x-ray showed small bilateral effusions.  She was started on IV Dilt bolus then drip.  Patient was continued on Cardizem drip, metoprolol 50 mg daily, lisinopril.  Heart rates improved and IV Cardizem was slowly weaned off.  She was on metoprolol 75 mg daily.  Patient remained tachycardic at rest.  Cardiology was asked to see for management of A-fib RVR.  Metoprolol was further increased to 100 mg daily and diltiazem was transitioned to 120  mg daily.  Patient was started on digoxin.  Cardioversion was offered, but she did not wish to pursue at that time.  Patient was last seen 04/28/2024 reporting persistent orthopnea and chest discomfort.  Patient was in rate controlled A-fib and started on amiodarone.  Today, the patient is in sinus bradycardia. She reports low appetite-unsure if it's from medications or  food at the facility. She does protein shake. Weight is down 3 lbs.   Studies Reviewed EKG Interpretation Date/Time:  Thursday May 21 2024 14:33:47 EDT Ventricular Rate:  52 PR Interval:  170 QRS Duration:  84 QT Interval:  558 QTC Calculation: 518 R Axis:   -8  Text Interpretation: Sinus bradycardia with Premature supraventricular complexes Minimal voltage criteria for LVH, may be normal variant ( R in aVL ) Cannot rule out Anterior infarct , age undetermined When compared with ECG of 28-Apr-2024 16:00, Sinus rhythm has replaced Atrial fibrillation Vent. rate has decreased BY  27 BPM QT has lengthened Confirmed by Franchester, Maicy Filip ([id number]) on 05/21/2024 2:36:30 PM     Echo 04/2024 1. Left ventricular ejection fraction, by estimation, is 55 to 60%. The  left ventricle has normal function. The left ventricle has no regional  wall motion abnormalities. Left ventricular diastolic parameters are  indeterminate.   2. Right ventricular systolic function is normal. The right ventricular  size is normal. There is normal pulmonary artery systolic pressure.  The  estimated right ventricular systolic pressure is 32.7 mmHg.   3. Left atrial size was moderately dilated.   4. Right atrial size was moderately dilated.   5. The mitral valve is normal in structure. Mild mitral valve  regurgitation. No evidence of mitral stenosis.   6. Tricuspid valve regurgitation is moderate.   7. The aortic valve is normal in structure. Aortic valve regurgitation is  mild. No aortic stenosis is present.   8. The inferior vena cava is normal in size  with greater than 50%  respiratory variability, suggesting right atrial pressure of 3 mmHg.    Risk Assessment/Calculations  CHA2DS2-VASc Score = 6   This indicates a 9.7% annual risk of stroke. The patient's score is based upon: CHF History: 1 HTN History: 1 Diabetes History: 0 Stroke History: 0 Vascular Disease History: 1 Age Score: 2 Gender Score: 1            Physical Exam VS:  BP 115/60 (BP Location: Left Arm, Patient Position: Sitting, Cuff Size: Normal)   Pulse (!) 52   Ht 5' 3 (1.6 m)   Wt 152 lb 12.8 oz (69.3 kg)   SpO2 99%   BMI 27.07 kg/m        Wt Readings from Last 3 Encounters:  05/21/24 152 lb 12.8 oz (69.3 kg)  04/28/24 155 lb (70.3 kg)  04/07/24 156 lb 15.5 oz (71.2 kg)    GEN: Well nourished, well developed in no acute distress NECK: No JVD; No carotid bruits CARDIAC: bradycardia, RR, no murmurs, rubs, gallops RESPIRATORY:  Clear to auscultation without rales, wheezing or rhonchi  ABDOMEN: Soft, non-tender, non-distended EXTREMITIES:  1+ lower leg edema; No deformity   ASSESSMENT AND PLAN  Paroxysmal Afib The patient was started on amiodarone and converted to sinus bradycardia. I will stop diltiazem. Continue Toprol 100mg  daily and amiodarone 200mg  daily. Continue Xarelto 20mg  daily for stroke ppx.   HTN BP is normal. Continue Toprol 100mg  daily.   HFpEF Lower leg edema is improving now that she is in SB and on lasix. Continue lasix 40mg  daily. She still has 1+ lower leg edema, stopping Diltiazem as above.        Dispo: Follow-up in 2-3 months  Signed, Kamar Callender VEAR Fishman, PA-C

## 2024-07-21 ENCOUNTER — Ambulatory Visit: Admitting: Medical

## 2024-07-30 ENCOUNTER — Ambulatory Visit: Admitting: Student

## 2024-08-20 ENCOUNTER — Ambulatory Visit: Admitting: Medical

## 2024-08-27 ENCOUNTER — Other Ambulatory Visit: Payer: Self-pay

## 2024-08-27 ENCOUNTER — Emergency Department
Admission: EM | Admit: 2024-08-27 | Discharge: 2024-08-27 | Disposition: A | Discharge status: Home / Self Care | Attending: Emergency Medicine | Admitting: Emergency Medicine

## 2024-08-27 ENCOUNTER — Emergency Department

## 2024-08-27 DIAGNOSIS — I4891 Unspecified atrial fibrillation: Secondary | ICD-10-CM | POA: Insufficient documentation

## 2024-08-27 DIAGNOSIS — Z7901 Long term (current) use of anticoagulants: Secondary | ICD-10-CM | POA: Diagnosis not present

## 2024-08-27 DIAGNOSIS — I110 Hypertensive heart disease with heart failure: Secondary | ICD-10-CM | POA: Insufficient documentation

## 2024-08-27 DIAGNOSIS — E8779 Other fluid overload: Secondary | ICD-10-CM | POA: Insufficient documentation

## 2024-08-27 DIAGNOSIS — I5030 Unspecified diastolic (congestive) heart failure: Secondary | ICD-10-CM | POA: Diagnosis not present

## 2024-08-27 DIAGNOSIS — R059 Cough, unspecified: Secondary | ICD-10-CM | POA: Diagnosis not present

## 2024-08-27 DIAGNOSIS — M7989 Other specified soft tissue disorders: Secondary | ICD-10-CM | POA: Diagnosis present

## 2024-08-27 LAB — URINALYSIS, ROUTINE W REFLEX MICROSCOPIC
Bilirubin Urine: NEGATIVE
Glucose, UA: NEGATIVE mg/dL
Hgb urine dipstick: NEGATIVE
Ketones, ur: NEGATIVE mg/dL
Leukocytes,Ua: NEGATIVE
Nitrite: NEGATIVE
Protein, ur: NEGATIVE mg/dL
Specific Gravity, Urine: 1.003 — ABNORMAL LOW (ref 1.005–1.030)
pH: 7 (ref 5.0–8.0)

## 2024-08-27 LAB — CBC WITH DIFFERENTIAL/PLATELET
Abs Immature Granulocytes: 0.02 K/uL (ref 0.00–0.07)
Basophils Absolute: 0 K/uL (ref 0.0–0.1)
Basophils Relative: 1 %
Eosinophils Absolute: 0.1 K/uL (ref 0.0–0.5)
Eosinophils Relative: 2 %
HCT: 36.7 % (ref 36.0–46.0)
Hemoglobin: 11.2 g/dL — ABNORMAL LOW (ref 12.0–15.0)
Immature Granulocytes: 1 %
Lymphocytes Relative: 20 %
Lymphs Abs: 0.8 K/uL (ref 0.7–4.0)
MCH: 30.7 pg (ref 26.0–34.0)
MCHC: 30.5 g/dL (ref 30.0–36.0)
MCV: 100.5 fL — ABNORMAL HIGH (ref 80.0–100.0)
Monocytes Absolute: 0.4 K/uL (ref 0.1–1.0)
Monocytes Relative: 10 %
Neutro Abs: 2.8 K/uL (ref 1.7–7.7)
Neutrophils Relative %: 66 %
Platelets: 214 K/uL (ref 150–400)
RBC: 3.65 MIL/uL — ABNORMAL LOW (ref 3.87–5.11)
RDW: 14.6 % (ref 11.5–15.5)
WBC: 4.2 K/uL (ref 4.0–10.5)
nRBC: 0 % (ref 0.0–0.2)

## 2024-08-27 LAB — BASIC METABOLIC PANEL WITH GFR
Anion gap: 11 (ref 5–15)
BUN: 14 mg/dL (ref 8–23)
CO2: 24 mmol/L (ref 22–32)
Calcium: 9.3 mg/dL (ref 8.9–10.3)
Chloride: 98 mmol/L (ref 98–111)
Creatinine, Ser: 0.81 mg/dL (ref 0.44–1.00)
GFR, Estimated: >60 mL/min (ref >60)
Glucose, Bld: 102 mg/dL — ABNORMAL HIGH (ref 70–99)
Potassium: 4 mmol/L (ref 3.5–5.1)
Sodium: 134 mmol/L — ABNORMAL LOW (ref 135–145)

## 2024-08-27 LAB — TROPONIN T, HIGH SENSITIVITY
Troponin T High Sensitivity: 23 ng/L — ABNORMAL HIGH (ref 0–19)
Troponin T High Sensitivity: 23 ng/L — ABNORMAL HIGH (ref 0–19)

## 2024-08-27 LAB — PRO BRAIN NATRIURETIC PEPTIDE: Pro Brain Natriuretic Peptide: 2095 pg/mL — ABNORMAL HIGH (ref <300.0)

## 2024-08-27 LAB — MAGNESIUM: Magnesium: 2.3 mg/dL (ref 1.7–2.4)

## 2024-08-27 MED ORDER — FUROSEMIDE 10 MG/ML IJ SOLN
80.0000 mg | Freq: Once | INTRAMUSCULAR | Status: AC
  Administered 2024-08-27: 80 mg via INTRAVENOUS
  Filled 2024-08-27: qty 8

## 2024-08-27 NOTE — ED Provider Notes (Signed)
 [facility] Provider Note    Event Date/Time   First MD Initiated Contact with Patient 08/27/24 [phone]     (approximate)   History   Weakness   HPI  Amy Shah is a 89 y.o. female who presents to the ED for evaluation of Weakness   I reviewed cardiology clinic visit from August.  History of A-fib on Xarelto , HTN, HLD.  Diastolic CHF.  Patient presents for evaluation of weakness, nonproductive cough with thin frothy sputum, increased lower extremity swelling.   During my initial evaluation while discussing with the patient signs of volume overload, x-ray results, etc. she is explicitly requesting IV diuresis in the ED and an attempt to go back to her facility today, she does not want to be admitted.  Physical Exam   Triage Vital Signs: ED Triage Vitals  Encounter Vitals Group     BP 08/27/24 0615 (!) 166/70     Girls Systolic BP Percentile --      Girls Diastolic BP Percentile --      Boys Systolic BP Percentile --      Boys Diastolic BP Percentile --      Pulse Rate 08/27/24 0615 (!) 53     Resp 08/27/24 0615 18     Temp 08/27/24 0615 97.9 F (36.6 C)     Temp src --      SpO2 08/27/24 0615 98 %     Weight 08/27/24 0614 140 lb (63.5 kg)     Height 08/27/24 0614 5' 3 (1.6 m)     Head Circumference --      Peak Flow --      Pain Score 08/27/24 0614 0     Pain Loc --      Pain Education --      Exclude from Growth Chart --     Most recent vital signs: Vitals:   08/27/24 0615 08/27/24 0900  BP: (!) 166/70 (!) 162/76  Pulse: (!) 53 (!) 55  Resp: 18 19  Temp: 97.9 F (36.6 C)   SpO2: 98% 94%    General: Awake, no distress.  CV:  Good peripheral perfusion.  Resp:  Normal effort.  Abd:  No distention.  MSK:  No deformity noted.  Pitting edema to bilateral lower extremities Neuro:  No focal deficits appreciated. Other:     ED Results / Procedures / Treatments   Labs (all labs ordered are listed, but only abnormal results are  displayed) Labs Reviewed  CBC WITH DIFFERENTIAL/PLATELET - Abnormal; Notable for the following components:      Result Value   RBC 3.65 (*)    Hemoglobin 11.2 (*)    MCV 100.5 (*)    All other components within normal limits  BASIC METABOLIC PANEL WITH GFR - Abnormal; Notable for the following components:   Sodium 134 (*)    Glucose, Bld 102 (*)    All other components within normal limits  URINALYSIS, ROUTINE W REFLEX MICROSCOPIC - Abnormal; Notable for the following components:   Color, Urine STRAW (*)    APPearance CLEAR (*)    Specific Gravity, Urine 1.003 (*)    All other components within normal limits  PRO BRAIN NATRIURETIC PEPTIDE - Abnormal; Notable for the following components:   Pro Brain Natriuretic Peptide 2,095.0 (*)    All other components within normal limits  TROPONIN T, HIGH SENSITIVITY - Abnormal; Notable for the following components:   Troponin T High Sensitivity 23 (*)  All other components within normal limits  TROPONIN T, HIGH SENSITIVITY - Abnormal; Notable for the following components:   Troponin T High Sensitivity 23 (*)    All other components within normal limits  MAGNESIUM    EKG Sinus rhythm with a rate of 53 bpm.  Treatment is baseline class fine detail.  No clear signs of acute ischemia.    RADIOLOGY 2 view CXR interpreted by me with pulmonary vascular congestion and bilateral pleural effusions  Official radiology report(s): DG Chest 2 View Result Date: 08/27/2024 EXAM: 2 VIEW(S) XRAY OF THE CHEST 08/27/2024 06:30:00 AM COMPARISON: 04/05/2024 CLINICAL HISTORY: cough FINDINGS: LUNGS AND PLEURA: Diffuse increased interstitial opacities, suggestive of pulmonary edema. Moderate left greater than right bilateral pleural effusions. No pneumothorax. HEART AND MEDIASTINUM: Aortic calcification. No acute abnormality of the cardiac and mediastinal silhouettes. BONES AND SOFT TISSUES: S-shaped spine curvature noted. No acute osseous abnormality.  IMPRESSION: 1. Diffuse increased interstitial opacities, suggestive of pulmonary edema. 2. Moderate left greater than right bilateral pleural effusions. 3. Right basilar opacities and left retrocardiac opacification which is favored to represent atelectasis in the setting of pleural effusions. Underlying pneumonia would be difficult to exclude. Electronically signed by: Waddell Calk MD 08/27/2024 07:25 AM EST RP Workstation: [id number]    PROCEDURES and INTERVENTIONS:  .1-3 Lead EKG Interpretation  Performed by: Claudene Rover, MD Authorized by: Claudene Rover, MD     Interpretation: normal     ECG rate:  58   ECG rate assessment: normal     Rhythm: sinus bradycardia     Ectopy: none     Conduction: normal     Medications  furosemide  (LASIX ) injection 80 mg (80 mg Intravenous Given 08/27/24 0841)     IMPRESSION / MDM / ASSESSMENT AND PLAN / ED COURSE  I reviewed the triage vital signs and the nursing notes.  Differential diagnosis includes, but is not limited to, symptomatic anemia, viral syndrome, sepsis, pneumonia, volume overload, ACS  {Patient presents with symptoms of an acute illness or injury that is potentially life-threatening.  89 year old with diastolic CHF presents with signs of volume overload.  Looks generally well has reassuring vital signs.  No hypoxia, distress.  X-ray with effusions and volume overload, elevated BNP and she is clinically volume overloaded as well.  Essentially normal metabolic panel and CBC, troponin is marginally elevated and we will trend this.  Likely secondary to her respiratory status and volume overload.  Will initiate IV diuresis and reassess.  I certainly considered medical admission for this patient but will try to facilitate her desire for diuresis and discharge pending clinical course.  Clinical Course as of 08/27/24 1109  Thu Aug 27, 2024  [name], is put out good amount of urine about 1.2 L and reports feeling much better and is  appreciative.  Has family at the bedside, daughter and son.  Patient is eager to go back to her facility and comfortable with discharge now.  We discussed increased Lasix  dosing for the next couple days, cardiology follow-up and ED return precautions [DS]    Clinical Course User Index [DS] Claudene Rover, MD     FINAL CLINICAL IMPRESSION(S) / ED DIAGNOSES   Final diagnoses:  Other hypervolemia     Rx / DC Orders   ED Discharge Orders     None        Note:  This document was prepared using Dragon voice recognition software and may include unintentional dictation errors.   Claudene Rover, MD 08/27/24 [phone]

## 2024-08-27 NOTE — ED Notes (Signed)
 Pt given water

## 2024-08-27 NOTE — ED Triage Notes (Signed)
 Pt to ED via EMS from [facility], pt reports generalized weakness and fatigue, pt also has had n/v for the past 2 days. Pt reports she was dx with COVID/Pneumonia last month.

## 2024-08-27 NOTE — Discharge Instructions (Signed)
 Please provide 80mg  oral Lasix  (double dose) the next 2 mornings - Friday and Saturday - and then return to normal 40 mg once daily after that.   Follow-up with cardiology as an outpatient
